# Patient Record
Sex: Female | Born: 1960 | Race: White | Hispanic: No | Marital: Married | Smoking: Former smoker
Health system: Southern US, Community
[De-identification: ages and names within clinical notes are randomized; demographics above are authoritative.]

## PROBLEM LIST (undated history)

## (undated) DIAGNOSIS — L309 Dermatitis, unspecified: Secondary | ICD-10-CM

## (undated) DIAGNOSIS — N6320 Unspecified lump in the left breast, unspecified quadrant: Secondary | ICD-10-CM

## (undated) DIAGNOSIS — F419 Anxiety disorder, unspecified: Secondary | ICD-10-CM

## (undated) DIAGNOSIS — E7800 Pure hypercholesterolemia, unspecified: Secondary | ICD-10-CM

## (undated) DIAGNOSIS — Z8619 Personal history of other infectious and parasitic diseases: Secondary | ICD-10-CM

## (undated) DIAGNOSIS — Z803 Family history of malignant neoplasm of breast: Secondary | ICD-10-CM

## (undated) DIAGNOSIS — J9819 Other pulmonary collapse: Secondary | ICD-10-CM

## (undated) DIAGNOSIS — N959 Unspecified menopausal and perimenopausal disorder: Secondary | ICD-10-CM

## (undated) DIAGNOSIS — K219 Gastro-esophageal reflux disease without esophagitis: Secondary | ICD-10-CM

## (undated) HISTORY — DX: Unspecified menopausal and perimenopausal disorder: N95.9

## (undated) HISTORY — DX: Unspecified lump in the left breast, unspecified quadrant: N63.20

## (undated) HISTORY — DX: Pure hypercholesterolemia, unspecified: E78.00

## (undated) HISTORY — DX: Anxiety disorder, unspecified: F41.9

## (undated) HISTORY — DX: Personal history of other infectious and parasitic diseases: Z86.19

## (undated) HISTORY — DX: Other pulmonary collapse: J98.19

## (undated) HISTORY — DX: Family history of malignant neoplasm of breast: Z80.3

## (undated) HISTORY — PX: BREAST BIOPSY: SHX20

## (undated) HISTORY — DX: Dermatitis, unspecified: L30.9

---
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
## 2003-08-23 DIAGNOSIS — J9819 Other pulmonary collapse: Secondary | ICD-10-CM

## 2003-08-23 HISTORY — DX: Other pulmonary collapse: J98.19

## 2004-11-09 ENCOUNTER — Inpatient Hospital Stay: Payer: Self-pay | Admitting: Surgery

## 2004-11-28 ENCOUNTER — Emergency Department: Payer: Self-pay | Admitting: General Surgery

## 2010-01-05 ENCOUNTER — Ambulatory Visit: Payer: Self-pay | Admitting: Obstetrics & Gynecology

## 2010-01-05 LAB — CONVERTED CEMR LAB
FSH: 42.7 milliintl units/mL
HCT: 45 % (ref 36.0–46.0)
Hemoglobin: 15 g/dL (ref 12.0–15.0)
MCHC: 33.3 g/dL (ref 30.0–36.0)
MCV: 95.7 fL (ref 78.0–100.0)
Platelets: 232 10*3/uL (ref 150–400)
Prolactin: 5.2 ng/mL
RBC: 4.7 M/uL (ref 3.87–5.11)
RDW: 13.3 % (ref 11.5–15.5)
TSH: 4.831 microintl units/mL — ABNORMAL HIGH (ref 0.350–4.500)
WBC: 5.3 10*3/uL (ref 4.0–10.5)

## 2010-01-15 ENCOUNTER — Encounter: Payer: Self-pay | Admitting: Obstetrics & Gynecology

## 2010-01-15 LAB — CONVERTED CEMR LAB
Free T4: 0.95 ng/dL (ref 0.80–1.80)
T3, Free: 3 pg/mL (ref 2.3–4.2)

## 2010-01-20 LAB — HM PAP SMEAR

## 2010-01-20 LAB — HM MAMMOGRAPHY

## 2010-01-20 LAB — CONVERTED CEMR LAB: Pap Smear: NORMAL

## 2010-03-09 ENCOUNTER — Ambulatory Visit: Payer: Self-pay | Admitting: Family Medicine

## 2010-03-09 DIAGNOSIS — N959 Unspecified menopausal and perimenopausal disorder: Secondary | ICD-10-CM | POA: Insufficient documentation

## 2010-03-09 DIAGNOSIS — L259 Unspecified contact dermatitis, unspecified cause: Secondary | ICD-10-CM | POA: Insufficient documentation

## 2010-03-09 DIAGNOSIS — F411 Generalized anxiety disorder: Secondary | ICD-10-CM | POA: Insufficient documentation

## 2010-03-09 DIAGNOSIS — N63 Unspecified lump in unspecified breast: Secondary | ICD-10-CM | POA: Insufficient documentation

## 2010-03-09 DIAGNOSIS — Z9189 Other specified personal risk factors, not elsewhere classified: Secondary | ICD-10-CM | POA: Insufficient documentation

## 2010-03-16 ENCOUNTER — Encounter: Payer: Self-pay | Admitting: Family Medicine

## 2010-03-23 ENCOUNTER — Encounter: Payer: Self-pay | Admitting: Family Medicine

## 2010-03-23 ENCOUNTER — Encounter: Admission: RE | Admit: 2010-03-23 | Discharge: 2010-03-23 | Payer: Self-pay | Admitting: General Surgery

## 2010-03-30 ENCOUNTER — Ambulatory Visit: Payer: Self-pay | Admitting: Family Medicine

## 2010-03-30 DIAGNOSIS — E7800 Pure hypercholesterolemia, unspecified: Secondary | ICD-10-CM | POA: Insufficient documentation

## 2010-04-02 LAB — CONVERTED CEMR LAB
ALT: 13 units/L (ref 0–35)
AST: 12 units/L (ref 0–37)
Albumin: 3.6 g/dL (ref 3.5–5.2)
Alkaline Phosphatase: 58 units/L (ref 39–117)
BUN: 12 mg/dL (ref 6–23)
Bilirubin, Direct: 0.1 mg/dL (ref 0.0–0.3)
CO2: 26 meq/L (ref 19–32)
Calcium: 8.8 mg/dL (ref 8.4–10.5)
Chloride: 105 meq/L (ref 96–112)
Cholesterol: 159 mg/dL (ref 0–200)
Creatinine, Ser: 0.7 mg/dL (ref 0.4–1.2)
GFR calc non Af Amer: 88.52 mL/min (ref >60)
Glucose, Bld: 81 mg/dL (ref 70–99)
HDL: 47.2 mg/dL (ref >39.00)
LDL Cholesterol: 97 mg/dL (ref 0–99)
Potassium: 4.1 meq/L (ref 3.5–5.1)
Sodium: 140 meq/L (ref 135–145)
Total Bilirubin: 0.5 mg/dL (ref 0.3–1.2)
Total CHOL/HDL Ratio: 3
Total Protein: 6.2 g/dL (ref 6.0–8.3)
Triglycerides: 74 mg/dL (ref 0.0–149.0)
VLDL: 14.8 mg/dL (ref 0.0–40.0)

## 2010-04-06 ENCOUNTER — Ambulatory Visit: Payer: Self-pay | Admitting: Family Medicine

## 2010-04-06 LAB — CONVERTED CEMR LAB
Cholesterol, target level: 200 mg/dL
HDL goal, serum: 40 mg/dL
LDL Goal: 160 mg/dL

## 2010-09-21 NOTE — Letter (Signed)
Summary: Biopsy Results Letter/[facility]  Biopsy Results Letter/[facility]   Imported By: Maryln Gottron 04/09/2010 13:26:00  _____________________________________________________________________  External Attachment:    Type:   Image     Comment:   External Document

## 2010-09-21 NOTE — Letter (Signed)
Summary: [facility]   Imported By: Lanelle Bal 04/05/2010 11:27:52  _____________________________________________________________________  External Attachment:    Type:   Image     Comment:   External Document

## 2010-09-21 NOTE — Assessment & Plan Note (Signed)
Summary: 1 m f/u mood/dlo   Vital Signs:  Patient profile:   50 year old female Height:      67 inches Weight:      201.8 pounds BMI:     31.72 Temp:     97.3 degrees F oral Pulse rate:   72 / minute Pulse rhythm:   regular BP sitting:   140 / 90  (left arm) Cuff size:   regular  Vitals Entered By: Benny Lennert CMA Duncan Dull) (April 06, 2010 9:07 AM)  History of Present Illness: Chief complaint 1 month followup   Anxiety, depression: On venalfaxine 37.5 mg daily. Significant improvement in mood..no anxiety. Doesn't feel on edge.  Sleeping  moderately well at night.  NO SI, NO HI. Now changed to second shift..has helped stress some. Has had some decreased appetitie.  3 lb weight loss.   Lipid Management History:      Negative NCEP/ATP III risk factors include female age less than 41 years old and non-tobacco-user status.        Her compliance with the TLC diet is fair.  The patient expresses understanding of adjunctive measures for cholesterol lowering.  Adjunctive measures started by the patient include aerobic exercise, fiber, omega-3 supplements, and weight reduction.    Problems Prior to Update: 1)  Pure Hypercholesterolemia  (ICD-272.0) 2)  Contact Dermatitis  (ICD-692.9) 3)  Breast Mass, Left  (ICD-611.72) 4)  Perimenopausal Syndrome  (ICD-627.9) 5)  Anxiety State, Unspecified  (ICD-300.00) 6)  Family History Breast Cancer 1st Degree Relative <50  (ICD-V16.3) 7)  Chickenpox, Hx of  (ICD-V15.9)  Current Medications (verified): 1)  Triamcinolone Acetonide 0.5 % Crea (Triamcinolone Acetonide) .... Apply To Affected Area Two Times A Day X 2 Week 2)  Venlafaxine Hcl 37.5 Mg Xr24h-Tab (Venlafaxine Hcl) .Marland Kitchen.. 1 Tab By Mouth Daily  Allergies (verified): No Known Drug Allergies  Past History:  Past medical, surgical, family and social histories (including risk factors) reviewed, and no changes noted (except as noted below).  Past Medical History: Reviewed history from  03/09/2010 and no changes required. Current Problems:  CHICKENPOX, HX OF (ICD-V15.9)    Past Surgical History: Reviewed history from 03/09/2010 and no changes required. breast biopsy 1981 Rt lung collaspe 2005  Family History: Reviewed history from 03/09/2010 and no changes required. Family History Breast cancer 1st degree relative <50 Family History Lung cancer Family History of Cardiovascular disorder father: CHF, no early MI. mother:  breast cancer, COPD siblings: healthy  Social History: Reviewed history from 03/09/2010 and no changes required. 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 Former Smoker  Review of Systems General:  Denies fatigue and fever. CV:  Denies chest pain or discomfort. Resp:  Denies shortness of breath.  Physical Exam  General:  overweight femal einNAD Mouth:  Oral mucosa and oropharynx without lesions or exudates.  Teeth in good repair. Neck:  no carotid bruit or thyromegaly no cervical or supraclavicular lymphadenopathy  Lungs:  Normal respiratory effort, chest expands symmetrically. Lungs are clear to auscultation, no crackles or wheezes. Heart:  Normal rate and regular rhythm. S1 and S2 normal without gallop, murmur, click, rub or other extra sounds. Abdomen:  Bowel sounds positive,abdomen soft and non-tender without masses, organomegaly or hernias noted. Pulses:  R and L posterior tibial pulses are full and equal bilaterally  Extremities:  no edema   Impression & Recommendations:  Problem # 1:  ANXIETY STATE, UNSPECIFIED (ICD-300.00) Improved on venlafaxine.  Working on stress reduction, relaxation.  Working on [employer] and weight loss, healthy lifestyle.  Not interested in increase in medication at this time.  Her updated medication list for this problem includes:     Venlafaxine Hcl 37.5 Mg Xr24h-tab (Venlafaxine hcl) .Marland Kitchen... 1 tab by mouth daily  Problem # 2:  PURE HYPERCHOLESTEROLEMIA (ICD-272.0) Well controlled.   Complete Medication List: 1)  Triamcinolone Acetonide 0.5 % Crea (Triamcinolone acetonide) .... Apply to affected area two times a day x 2 week 2)  Venlafaxine Hcl 37.5 Mg Xr24h-tab (Venlafaxine hcl) .Marland Kitchen.. 1 tab by mouth daily  Lipid Assessment/Plan:      Based on NCEP/ATP III, the patient's risk factor category is "0-1 risk factors".  The patient's lipid goals are as follows: Total cholesterol goal is 200; LDL cholesterol goal is 160; HDL cholesterol goal is 40; Triglyceride goal is 150.  Her LDL cholesterol goal has been met.     Patient Instructions: 1)  Please schedule a follow-up appointment in 1 year.  2)  Follow up earlier if needed.  Current Allergies (reviewed today): No known allergies

## 2010-09-21 NOTE — Assessment & Plan Note (Signed)
Summary: NEW PATIENT/[facility]   Vital Signs:  Patient profile:   50 year old female Height:      67 inches Weight:      204.8 pounds BMI:     32.19 Temp:     97.8 degrees F oral Pulse rate:   72 / minute Pulse rhythm:   regular BP sitting:   118 / 78  (left arm) Cuff size:   regular  Vitals Entered By: Benny Lennert CMA Duncan Dull) (March 09, 2010 10:19 AM)  History of Present Illness: Chief complaint new patient to be established    Seeing GYN.. going through perimenopause... mood issues most bothersome. Occ stress Anxiety/depression, moderate x 6 month to 1 year... recently given effexor 75 mg daily...never started.   Family and work stress.  No specific panic attacks...more generalized during the day. No Si. Anhedonia, no motivation. 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 Zoloft in past gave her SE.   Has supportive friends.. not interested in counseling.  Rash on arms abdomen .Marland Kitchennoted in past 2-3 days, moderately itchy. No specific   Preventive Screening-Counseling & Management  Alcohol-Tobacco     Smoking Status: quit  Caffeine-Diet-Exercise     Does Patient Exercise: no      Drug Use:  no.    Allergies (verified): No Known Drug Allergies  Past History:  Past Medical History: Current Problems:  CHICKENPOX, HX OF (ICD-V15.9)    Past Surgical History: breast biopsy 1981 Rt lung collaspe 2005  Family History: Family History Breast cancer 1st degree relative <50 Family History Lung cancer Family History of Cardiovascular disorder father: CHF, no early MI. 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 Former Smoker Occupation:  employed Drug Use:  no Does Patient Exercise:  no Smoking Status:  quit  Review of Systems General:  Complains of  fatigue; denies fever. CV:  Denies chest pain or discomfort. Resp:  Denies shortness of breath, sputum productive, and wheezing. GI:  Complains of constipation; denies abdominal pain and diarrhea. GU:  Denies dysuria.  Physical Exam  General:  overweight femal einNAD Ears:  External ear exam shows no significant lesions or deformities.  Otoscopic examination reveals clear canals, tympanic membranes are intact bilaterally without bulging, retraction, inflammation or discharge. Hearing is grossly normal bilaterally. Nose:  External nasal examination shows no deformity or inflammation. Nasal mucosa are pink and moist without lesions or exudates. Mouth:  Oral mucosa and oropharynx without lesions or exudates.  Teeth in good repair. Neck:  no carotid bruit or thyromegaly no cervical or supraclavicular lymphadenopathy  Lungs:  Normal respiratory effort, chest expands symmetrically. Lungs are clear to auscultation, no crackles or wheezes. Heart:  Normal rate and regular rhythm. S1 and S2 normal without gallop, murmur, click, rub or other extra sounds. Abdomen:  Bowel sounds positive,abdomen soft and non-tender without masses, organomegaly or hernias noted. Msk:  No deformity or scoliosis noted of thoracic or lumbar spine.   Pulses:  R and L posterior tibial pulses are full and equal bilaterally  Extremities:  no edema Neurologic:  No cranial nerve deficits noted. Station and gait are normal. Plantar reflexes are down-going bilaterally. DTRs are symmetrical throughout. Sensory, motor and coordinative functions appear intact. Skin:  Abdominal and arms...erythematous papules, excoriationsNo pustules, no blisters no discharge.  Psych:  Oriented X3 and slightly anxious.  Impression & Recommendations:  Problem # 1:  CONTACT DERMATITIS (ICD-692.9) Treat with topical steroid. Call if not improving. Review possible environmental causes.  Her updated medication list for this problem includes:     Triamcinolone Acetonide 0.5 % Crea (Triamcinolone acetonide) .Marland Kitchen... Apply to affected area two times a day x 2 week  Problem # 2:  PERIMENOPAUSAL SYNDROME (ICD-627.9) See #3...minimal vasomotor symptoms.   Problem # 3:  ANXIETY STATE, UNSPECIFIED (ICD-300.00) Poor control. .discussed options of treatment. Refuse behavoiral med  Her updated medication list for this problem includes:    Venlafaxine Hcl 37.5 Mg Xr24h-tab (Venlafaxine hcl) .Marland Kitchen... 1 tab by mouth daily  Problem # 4:  BREAST MASS, LEFT (ICD-611.72) Per GYN and radiology. WIll follow along.   Complete Medication List: 1)  Triamcinolone Acetonide 0.5 % Crea (Triamcinolone acetonide) .... Apply to affected area two times a day x 2 week 2)  Venlafaxine Hcl 37.5 Mg Xr24h-tab (Venlafaxine hcl) .Marland Kitchen.. 1 tab by mouth daily  Patient Instructions: 1)  Schedule lipids, CMET Dx 272.0 FASTING. 2)   Start venlafaxine 1 tab p daily. 3)  Please schedule a follow-up appointment in 1 month mood. Prescriptions: VENLAFAXINE HCL 37.5 MG XR24H-TAB (VENLAFAXINE HCL) 1 tab by mouth daily  #30 x 5   Entered and Authorized by:   Kerby Nora MD   Signed by:   Kerby Nora MD on 03/09/2010   Method used:   Electronically to        [facility]* (retail)       [address]       [place], [place]  [address]       Ph: [phone]       Fax: [phone]   RxID:   [id number] TRIAMCINOLONE ACETONIDE 0.5 % CREA (TRIAMCINOLONE ACETONIDE) Apply to affected area two times a day x 2 week  #30-60 mg x 1   Entered and Authorized by:   Kerby Nora MD   Signed by:   Kerby Nora MD on 03/09/2010   Method used:   Electronically to        [facility]* (retail)       [address]       [place], [place]  [address]       Ph: [phone]       Fax: [phone]   RxID:   [id number]   Prior Medications (reviewed today): None Current Allergies (reviewed today): No known allergies  PAP Result Date:   01/20/2010 PAP Result:  normal PAP Next Due:  1 yr Mammogram Result Date:  01/20/2010 Mammogram Result:  abnormal mammogram, left breast mass, scheduled for biopsy

## 2011-01-04 NOTE — Assessment & Plan Note (Signed)
NAME:  Cindy Mccoy, WEAVER NO.:  [id number]   MEDICAL RECORD NO.:  [id number]          PATIENT TYPE:  POB   LOCATION:  [facility] at [facility]         FACILITY:  [facility]   PHYSICIAN:  Jaynie Collins, MD     DATE OF BIRTH:  1961-01-31   DATE OF SERVICE:  01/05/2010                                  CLINIC NOTE   CHIEF COMPLAINT:  Establishment of gynecologic care and possible  perimenopausal symptoms.   HISTORY OF PRESENT ILLNESS:  The patient is a 50 year old gravida 5,  para 2-0-3-2 who is here today to establish gynecologic care.  The  patient has not seen a gynecologist over 11 years and denies any history  of abnormal Pap smears.  She has not had a mammogram in over 11 years  also and not seen a doctor in the same period of time.  The reason for  her appointment today is that she has noted that in the last few months,  her periods have gotten more irregular and earlier this month she did  have a period that lasted for 2 weeks with heavy flow and passing clots.  This is very usual for her and this was also associated with some  moderate pain.  The patient denies any other abnormal vaginal discharge  or any other gynecologic symptoms.  She is sexually active and uses  condoms for contraception.  She is interested in going on a hormonal  contraception at this point, specifically the NuvaRing.  She also is  endorsing hot flashes, mood swings and night sweats and wants to be put  on a medication to help with these symptoms.  She denies any other  concerns.   PAST OBSTETRIC AND GYNECOLOGIC HISTORY:  The patient had menarche at age  32.  She had normal menstrual periods until the last few months.  She  does deny any intermenstrual bleeding.  The patient has had 2 vaginal  deliveries and 3 miscarriages.  She uses condoms for contraception.  She  denies any sexually transmitted infections or cervical dysplasia.  Her  last Pap smear and mammogram were more than 11 years ago and  she has  also not seen any doctor in that period of time.   PAST MEDICAL HISTORY:  None.   PAST SURGICAL HISTORY:  The patient had a pneumothorax which was fixed  in [facility] in March 2011.  She also had a  biopsy of her left breast at age 50.   MEDICATIONS:  None.   ALLERGIES:  No known drug allergies.   FAMILY HISTORY:  The patient does have a family history of breast cancer  in her mother and her father has heart disease and heart attack.  No  other gynecologic cancers.   SOCIAL HISTORY:  The patient lives with her family.  She does not smoke,  drink alcohol, or use any illicit drugs.   REVIEW OF SYSTEMS:  The patient endorses fatigue, night sweats, mood  swings and hot flashes.   PHYSICAL EXAMINATION:  VITAL SIGNS:  Pulse is 64, blood pressure is  137/92, weight is 200 pounds, height is 5 feet 7 inches.  GENERAL:  No apparent distress.  HEENT:  Normocephalic, atraumatic.  NECK:  Supple.  Normal thyroid.  LUNGS:  Clear to auscultation bilaterally.  HEART:  Regular rate and rhythm.  ABDOMEN:  Soft, nontender, and nondistended.  BREASTS:  Symmetric in size, nontender.  No abnormal masses, skin  changes, lymphadenopathy, or nipple drainage noted.  EXTREMITIES:  The patient does have bilateral pedal cyanosis noted, but  good capillary refill.  She does report that she feels cold in her legs.  The rest of her bilateral lower extremities appear normal.  No clubbing  and no edema were appreciated.  PELVIC:  Normal external female genitalia with mild atrophy noted in the  vestibular area and mild loss of rugae was noted also in the vagina.  Normal discharge.  No lesions seen.  Normal multiparous cervical os.  A  Pap smear was obtained.  On bimanual exam, the patient had a normal-  sized mobile uterus.  No adnexal masses were palpated secondary to  habitus and no tenderness on bimanual examination.   ASSESSMENT AND PLAN:  The patient is a 50 year old  gravida 5, para 2-0-3-  2 who is here to establish gynecologic care after several years.  The  patient just had a Pap smear today and we will follow up the results.  She had a normal breast examination, but a mammogram will be done for  further evaluation and that will be scheduled at the end of this  encounter.  The patient will also be referred to [facility] for  primary care and other preventative health maintenance issues.  Given  her irregular menses, labs will be checked including a CBC, TSH,  prolactin and FSH.  A pelvic ultrasound would also be obtained for  further evaluation.  The patient was told that depending on the results  of this and also if her bleeding continues to be concerning, she will  need an endometrial biopsy to rule out any hyperplasia or neoplasia and  the patient is agreeable to this.  The patient is interested in using a  NuvaRing for now for birth control method.  She does not want to  continue to use condoms.  The risks of hormonal therapy were explained  in detail including the risks of DVTs, PE, strokes and cancer,  especially her breast cancer.  The patient does want to proceed with  using a NuvaRing and she was given samples for this.  As for her mood  symptoms, she was told that she might have an effect from the NuvaRing  and estrogen absorption in her system that might help her perimenopausal  symptoms, but the patient does want something that is specific to her  mood, so she was given a prescription for Effexor XR 75 mg daily and  told that it could take up to 4-6 weeks to get to a steady state.  She  was told to come in for discussion of all the results of these studies  and we would see if there is any further evaluation that needs to be  done, specifically an endometrial biopsy.  The patient did voiced  understanding of plan.  She was told to call or come back in for any  further gynecologic symptoms or concerns.  The patient will also  follow  up with her appointment at [facility] to establish primary care.           ______________________________  Jaynie Collins, MD     UA/MEDQ  D:  01/05/2010  T:  01/05/2010  Job:  [id number] 

## 2011-12-01 ENCOUNTER — Encounter: Payer: Self-pay | Admitting: Family Medicine

## 2011-12-01 ENCOUNTER — Ambulatory Visit (INDEPENDENT_AMBULATORY_CARE_PROVIDER_SITE_OTHER): Payer: BC Managed Care – PPO | Admitting: Family Medicine

## 2011-12-01 DIAGNOSIS — J309 Allergic rhinitis, unspecified: Secondary | ICD-10-CM

## 2011-12-01 MED ORDER — FLUTICASONE PROPIONATE 50 MCG/ACT NA SUSP: 2.0000 | Freq: Every day | NASAL | Status: DC

## 2011-12-01 NOTE — Patient Instructions (Signed)
I think is allergic rhinitis.  Use flonase as directed.   Drink lots of fluids.  Treat sympotmatically with Mucinex, nasal saline irrigation, and Tylenol/Ibuprofen. Also try claritin D or zyrtec D over the counter- two times a day as needed ( have to sign for them at pharmacy). Call if not improving as expected in 5-7 days.

## 2011-12-01 NOTE — Progress Notes (Signed)
SUBJECTIVE:  Cindy Mccoy is a 51 y.o. female who complains of coryza, congestion, sneezing and itching in eyes for 7 days. She denies a history of anorexia, chest pain, chills, dizziness, fatigue and fevers and denies a history of asthma. Patient denies smoke cigarettes.   Patient Active Problem List  Diagnoses  . PURE HYPERCHOLESTEROLEMIA  . ANXIETY STATE, UNSPECIFIED  . BREAST MASS, LEFT  . PERIMENOPAUSAL SYNDROME  . CONTACT DERMATITIS  . CHICKENPOX, HX OF   Past Medical History  Diagnosis Date  . Anxiety   . Breast mass, left   . History of chicken pox   . Dermatitis     contact  . Pure hypercholesterolemia   . Family history of malignant neoplasm of breast   . Unspecified menopausal and postmenopausal disorder   . Collapsed lung 2005   Past Surgical History  Procedure Date  . Breast biopsy 1981   History  Substance Use Topics  . Smoking status: Former Games developer  . Smokeless tobacco: Not on file   Comment: 29 pack per year history, quit several years ago  . Alcohol Use: Yes     1-2 drinks per month   Family History  Problem Relation Age of Onset  . Cancer Mother     breast  . COPD Mother   . Heart disease Father     CHF   Allergies not on file No current outpatient prescriptions on file prior to visit.   The PMH, PSH, Social History, Family History, Medications, and allergies have been reviewed in [facility], and have been updated if relevant.  OBJECTIVE: BP 128/82  Pulse 64  Temp(Src) 97.4 F (36.3 C) (Oral)  Wt 210 lb (95.255 kg)  She appears well, vital signs are as noted. Ears normal.  Throat and pharynx normal.  Neck supple. No adenopathy in the neck. Nose is congested. Sinuses non tender. The chest is clear, without wheezes or rales.  ASSESSMENT:  allergic rhinitis  PLAN: Symptomatic therapy suggested: flonase, push fluids, rest and return office visit prn if symptoms persist or worsen. Lack of antibiotic effectiveness discussed with her. Call or  return to clinic prn if these symptoms worsen or fail to improve as anticipated.

## 2011-12-13 ENCOUNTER — Encounter: Payer: Self-pay | Admitting: Obstetrics & Gynecology

## 2011-12-13 ENCOUNTER — Ambulatory Visit: Payer: BC Managed Care – PPO | Admitting: Obstetrics & Gynecology

## 2011-12-13 ENCOUNTER — Other Ambulatory Visit: Payer: Self-pay | Admitting: Obstetrics & Gynecology

## 2011-12-13 ENCOUNTER — Ambulatory Visit (INDEPENDENT_AMBULATORY_CARE_PROVIDER_SITE_OTHER): Payer: BC Managed Care – PPO | Admitting: Obstetrics & Gynecology

## 2011-12-13 VITALS — BP 120/93 | HR 68 | Ht 66.75 in | Wt 208.0 lb

## 2011-12-13 DIAGNOSIS — Z124 Encounter for screening for malignant neoplasm of cervix: Secondary | ICD-10-CM

## 2011-12-13 DIAGNOSIS — Z113 Encounter for screening for infections with a predominantly sexual mode of transmission: Secondary | ICD-10-CM

## 2011-12-13 DIAGNOSIS — Z1231 Encounter for screening mammogram for malignant neoplasm of breast: Secondary | ICD-10-CM

## 2011-12-13 DIAGNOSIS — Z0000 Encounter for general adult medical examination without abnormal findings: Secondary | ICD-10-CM

## 2011-12-13 DIAGNOSIS — Z01419 Encounter for gynecological examination (general) (routine) without abnormal findings: Secondary | ICD-10-CM

## 2011-12-13 DIAGNOSIS — N951 Menopausal and female climacteric states: Secondary | ICD-10-CM

## 2011-12-13 MED ORDER — FLUOXETINE HCL 20 MG PO TABS: 20.0000 mg | ORAL_TABLET | Freq: Every day | ORAL | Status: DC

## 2011-12-13 NOTE — Progress Notes (Signed)
Patient is here for routine physical.  Has questions about menopause, no cycle for 6 months and then had one last week.  She also qualifies for BRCA  Screening as her mother and aunt have breast cancer.

## 2011-12-13 NOTE — Progress Notes (Signed)
Subjective:    Cindy Mccoy is a 51 y.o. female who presents for an annual exam. She complains of skipping 6 periods this year and feeling depressed due to "hormone imbalance". She is afraid of breast cancer and does not want HRT.  The patient is sexually active. GYN screening history: last pap: was normal. The patient wears seatbelts: yes. The patient participates in regular exercise: no. Has the patient ever been transfused or tattooed?: no. The patient reports that there is not domestic violence in her life.   Menstrual History: OB History    Grav Para Term Preterm Abortions TAB SAB Ect Mult Living                  Menarche age: 14 Patient's last menstrual period was 12/06/2011.    The following portions of the patient's history were reviewed and updated as appropriate: allergies, current medications, past family history, past medical history, past social history, past surgical history and problem list.  Review of Systems A comprehensive review of systems was negative.    Objective:    BP 120/93  Pulse 68  Ht 5' 6.75" (1.695 m)  Wt 208 lb (94.348 kg)  BMI 32.82 kg/m2  LMP 12/06/2011  General Appearance:    Alert, cooperative, no distress, appears stated age  Head:    Normocephalic, without obvious abnormality, atraumatic  Eyes:    PERRL, conjunctiva/corneas clear, EOM's intact, fundi    benign, both eyes  Ears:    Normal TM's and external ear canals, both ears  Nose:   Nares normal, septum midline, mucosa normal, no drainage    or sinus tenderness  Throat:   Lips, mucosa, and tongue normal; teeth and gums normal  Neck:   Supple, symmetrical, trachea midline, no adenopathy;    thyroid:  no enlargement/tenderness/nodules; no carotid   bruit or JVD  Back:     Symmetric, no curvature, ROM normal, no CVA tenderness  Lungs:     Clear to auscultation bilaterally, respirations unlabored  Chest Wall:    No tenderness or deformity   Heart:    Regular rate and rhythm, S1 and S2  normal, no murmur, rub   or gallop  Breast Exam:    No tenderness, masses, or nipple abnormality  Abdomen:     Soft, non-tender, bowel sounds active all four quadrants,    no masses, no organomegaly  Genitalia:    Normal female without lesion, discharge or tenderness, NSS mid plane, NT, non-enlarged adnexa     Extremities:   Extremities normal, atraumatic, no cyanosis or edema  Pulses:   2+ and symmetric all extremities  Skin:   Skin color, texture, turgor normal, no rashes or lesions  Lymph nodes:   Cervical, supraclavicular, and axillary nodes normal  Neurologic:   CNII-XII intact, normal strength, sensation and reflexes    throughout  .    Assessment:    Healthy female exam.  Perimenopausal depression   Plan:     pap smear Prozac 20 mg daily RTC 1 month

## 2011-12-14 ENCOUNTER — Ambulatory Visit
Admission: RE | Admit: 2011-12-14 | Discharge: 2011-12-14 | Disposition: A | Discharge status: Home / Self Care | Payer: BC Managed Care – PPO | Source: Ambulatory Visit | Attending: Obstetrics & Gynecology | Admitting: Obstetrics & Gynecology

## 2011-12-14 DIAGNOSIS — Z1231 Encounter for screening mammogram for malignant neoplasm of breast: Secondary | ICD-10-CM

## 2011-12-14 LAB — COMPREHENSIVE METABOLIC PANEL
ALT: 13 U/L (ref 0–35)
AST: 14 U/L (ref 0–37)
Albumin: 4.3 g/dL (ref 3.5–5.2)
Alkaline Phosphatase: 59 U/L (ref 39–117)
BUN: 13 mg/dL (ref 6–23)
CO2: 26 mEq/L (ref 19–32)
Calcium: 8.7 mg/dL (ref 8.4–10.5)
Chloride: 105 mEq/L (ref 96–112)
Creat: 0.78 mg/dL (ref 0.50–1.10)
Glucose, Bld: 109 mg/dL — ABNORMAL HIGH (ref 70–99)
Potassium: 4.2 mEq/L (ref 3.5–5.3)
Sodium: 141 mEq/L (ref 135–145)
Total Bilirubin: 0.5 mg/dL (ref 0.3–1.2)
Total Protein: 6.6 g/dL (ref 6.0–8.3)

## 2011-12-14 LAB — TSH: TSH: 7.415 u[IU]/mL — ABNORMAL HIGH (ref 0.350–4.500)

## 2011-12-14 LAB — CBC
HCT: 46.5 % — ABNORMAL HIGH (ref 36.0–46.0)
Hemoglobin: 15.1 g/dL — ABNORMAL HIGH (ref 12.0–15.0)
MCH: 30.7 pg (ref 26.0–34.0)
MCHC: 32.5 g/dL (ref 30.0–36.0)
MCV: 94.5 fL (ref 78.0–100.0)
Platelets: 214 10*3/uL (ref 150–400)
RBC: 4.92 MIL/uL (ref 3.87–5.11)
RDW: 13.6 % (ref 11.5–15.5)
WBC: 5.4 10*3/uL (ref 4.0–10.5)

## 2011-12-14 NOTE — Progress Notes (Signed)
Addended by: Vinnie Langton C on: 12/14/2011 09:09 AM   Modules accepted: Orders

## 2011-12-21 ENCOUNTER — Telehealth: Payer: Self-pay | Admitting: *Deleted

## 2011-12-21 NOTE — Telephone Encounter (Signed)
Message copied by Meleny Cower on Wed Dec 21, 2011  4:03 PM ------      Message from: Allie Bossier      Created: Tue Dec 20, 2011  9:44 AM       She needs to have an appt with me because her thyroid isn't working and I'll need to start her on synthroid!

## 2011-12-21 NOTE — Telephone Encounter (Signed)
Patient notified of thyroid.

## 2012-01-02 ENCOUNTER — Ambulatory Visit (INDEPENDENT_AMBULATORY_CARE_PROVIDER_SITE_OTHER): Payer: BC Managed Care – PPO | Admitting: Obstetrics & Gynecology

## 2012-01-02 VITALS — BP 128/93 | HR 71 | Wt 199.1 lb

## 2012-01-02 DIAGNOSIS — E039 Hypothyroidism, unspecified: Secondary | ICD-10-CM

## 2012-01-02 MED ORDER — LEVOTHYROXINE SODIUM 25 MCG PO TABS: 25.0000 ug | ORAL_TABLET | Freq: Every day | ORAL | Status: DC

## 2012-01-02 NOTE — Progress Notes (Signed)
  Subjective:    Patient ID: Cindy Mccoy, female    DOB: Jul 18, 1961, 51 y.o.   MRN: [id number]  HPI Cindy Mccoy is here today because her TSH indicates hypothyroidism. I will start her on 25 mcg of synthroid and titrate her dose gradually. I have told her that once her TSH is stable and normal, I will wean off her Prozac. She does think the prozac has helped slightly.   Review of Systems   Her fasting glucose was 109 and will need to be rechecked. Objective:   Physical Exam        Assessment & Plan:  As above

## 2012-04-24 ENCOUNTER — Other Ambulatory Visit (INDEPENDENT_AMBULATORY_CARE_PROVIDER_SITE_OTHER): Payer: BC Managed Care – PPO | Admitting: *Deleted

## 2012-04-24 ENCOUNTER — Encounter: Payer: Self-pay | Admitting: Obstetrics & Gynecology

## 2012-04-24 DIAGNOSIS — E0590 Thyrotoxicosis, unspecified without thyrotoxic crisis or storm: Secondary | ICD-10-CM

## 2012-04-24 LAB — TSH: TSH: 6.41 u[IU]/mL — ABNORMAL HIGH (ref 0.350–4.500)

## 2012-04-26 ENCOUNTER — Telehealth: Payer: Self-pay | Admitting: *Deleted

## 2012-04-26 DIAGNOSIS — E0590 Thyrotoxicosis, unspecified without thyrotoxic crisis or storm: Secondary | ICD-10-CM

## 2012-04-26 MED ORDER — LEVOTHYROXINE SODIUM 50 MCG PO TABS: 50.0000 ug | ORAL_TABLET | Freq: Every day | ORAL | Status: DC

## 2012-04-26 NOTE — Telephone Encounter (Signed)
Message copied by Panhia Cower on Thu Apr 26, 2012  2:11 PM ------      Message from: Nicholaus Bloom C      Created: Wed Apr 25, 2012  8:31 AM       Hi.      I enjoyed working with you yesterday!      Can you please increase her synthroid from 25 to 50 mcg per day and tell her to come in for another TSH in about 2 months?      Thanks

## 2012-07-30 ENCOUNTER — Other Ambulatory Visit: Payer: BC Managed Care – PPO

## 2012-08-01 ENCOUNTER — Other Ambulatory Visit (INDEPENDENT_AMBULATORY_CARE_PROVIDER_SITE_OTHER): Payer: BC Managed Care – PPO | Admitting: *Deleted

## 2012-08-01 DIAGNOSIS — E0590 Thyrotoxicosis, unspecified without thyrotoxic crisis or storm: Secondary | ICD-10-CM

## 2012-08-01 NOTE — Progress Notes (Signed)
Needs follow up for tsh to determine dose.

## 2012-08-02 ENCOUNTER — Telehealth: Payer: Self-pay | Admitting: *Deleted

## 2012-08-02 DIAGNOSIS — E0590 Thyrotoxicosis, unspecified without thyrotoxic crisis or storm: Secondary | ICD-10-CM

## 2012-08-02 LAB — TSH: TSH: 8.962 u[IU]/mL — ABNORMAL HIGH (ref 0.350–4.500)

## 2012-08-02 MED ORDER — LEVOTHYROXINE SODIUM 75 MCG PO TABS: 75.0000 ug | ORAL_TABLET | Freq: Every day | ORAL | Status: DC

## 2012-08-02 NOTE — Telephone Encounter (Signed)
Patient notified of result and dose change has been called in per Dr. Marice Potter.

## 2012-10-12 ENCOUNTER — Telehealth: Payer: Self-pay | Admitting: Family Medicine

## 2012-10-12 NOTE — Telephone Encounter (Signed)
Pt sch for 10/16/2012

## 2012-10-12 NOTE — Telephone Encounter (Addendum)
Pt needs a CPE for ins purposes prior to the end of the month, but can only come in around 8:00 a.m.-8:15 a.m. She needs this prior to 10/19/2012. Can you accommodate this apptmt for a CPE? Thank you.

## 2012-10-12 NOTE — Telephone Encounter (Signed)
Yes.. Put her in on 2/25 Tuesday 8:15-8:45 ASAP before slot fills.  I CC'd: [employer]

## 2012-10-16 ENCOUNTER — Encounter: Payer: BC Managed Care – PPO | Admitting: Family Medicine

## 2012-10-23 ENCOUNTER — Encounter: Payer: BC Managed Care – PPO | Admitting: Family Medicine

## 2012-11-16 ENCOUNTER — Encounter: Payer: Self-pay | Admitting: Family Medicine

## 2012-11-16 ENCOUNTER — Ambulatory Visit (INDEPENDENT_AMBULATORY_CARE_PROVIDER_SITE_OTHER): Payer: BC Managed Care – PPO | Admitting: Family Medicine

## 2012-11-16 VITALS — BP 130/82 | HR 73 | Temp 98.1°F | Ht 66.75 in | Wt 216.0 lb

## 2012-11-16 DIAGNOSIS — F411 Generalized anxiety disorder: Secondary | ICD-10-CM

## 2012-11-16 DIAGNOSIS — E039 Hypothyroidism, unspecified: Secondary | ICD-10-CM | POA: Insufficient documentation

## 2012-11-16 DIAGNOSIS — E7800 Pure hypercholesterolemia, unspecified: Secondary | ICD-10-CM

## 2012-11-16 DIAGNOSIS — Z1212 Encounter for screening for malignant neoplasm of rectum: Secondary | ICD-10-CM

## 2012-11-16 NOTE — Progress Notes (Signed)
Subjective:    Patient ID: Cindy Mccoy, female    DOB: 01/23/1961, 52 y.o.   MRN: [id number]  HPI The patient is here for chronic health issue maintenance.   She sees GYN for PAP smears and CPX. Last pap was done 11/2011   Has noted knot on knee.  Elevated Cholesterol: Due for re-eval. On no medication. Diet compliance:" clean eating" Exercise: 4-5 times a week, cardio Other complaints: Wt Readings from Last 3 Encounters:  11/16/12 216 lb (97.977 kg)  01/02/12 199 lb 2 oz (90.323 kg)  12/13/11 208 lb (94.348 kg)     Hypothyroid:  On synthroid 75 mcg per Dr. Marice Potter. Due for re-eval.  Lab Results  Component Value Date   TSH 8.962* 08/01/2012   Anxiety, well controlled. Now off prozac, did not tolerate.   Review of Systems  Constitutional: Negative for fever, fatigue and unexpected weight change.  HENT: Negative for ear pain, congestion, sore throat, sneezing, trouble swallowing and sinus pressure.   Eyes: Negative for pain and itching.  Respiratory: Negative for cough, shortness of breath and wheezing.   Cardiovascular: Negative for chest pain, palpitations and leg swelling.  Gastrointestinal: Negative for nausea, abdominal pain, diarrhea, constipation and blood in stool.  Genitourinary: Negative for dysuria, hematuria, vaginal discharge, difficulty urinating and menstrual problem.  Skin: Negative for rash.  Neurological: Negative for syncope, weakness, light-headedness, numbness and headaches.  Psychiatric/Behavioral: Negative for confusion and dysphoric mood. The patient is not nervous/anxious.        Objective:   Physical Exam  Constitutional: Vital signs are normal. She appears well-developed and well-nourished. She is cooperative.  Non-toxic appearance. She does not appear ill. No distress.  HENT:  Head: Normocephalic.  Right Ear: Hearing, tympanic membrane, external ear and ear canal normal. Tympanic membrane is not erythematous, not retracted and not bulging.   Left Ear: Hearing, tympanic membrane, external ear and ear canal normal. Tympanic membrane is not erythematous, not retracted and not bulging.  Nose: No mucosal edema or rhinorrhea. Right sinus exhibits no maxillary sinus tenderness and no frontal sinus tenderness. Left sinus exhibits no maxillary sinus tenderness and no frontal sinus tenderness.  Mouth/Throat: Uvula is midline, oropharynx is clear and moist and mucous membranes are normal.  Eyes: Conjunctivae, EOM and lids are normal. Pupils are equal, round, and reactive to light. No foreign bodies found.  Neck: Trachea normal and normal range of motion. Neck supple. Carotid bruit is not present. No mass and no thyromegaly present.  Cardiovascular: Normal rate, regular rhythm, S1 normal, S2 normal, normal heart sounds, intact distal pulses and normal pulses.  Exam reveals no gallop and no friction rub.   No murmur heard. Pulmonary/Chest: Effort normal and breath sounds normal. Not tachypneic. No respiratory distress. She has no decreased breath sounds. She has no wheezes. She has no rhonchi. She has no rales.  Abdominal: Soft. Normal appearance and bowel sounds are normal. There is no tenderness.  Musculoskeletal:       Right knee: She exhibits normal range of motion. No tenderness found.  Right anterior knee mobile firm pea size cysts x 2 , nontender  Neurological: She is alert.  Skin: Skin is warm, dry and intact. No rash noted.  Psychiatric: Her speech is normal and behavior is normal. Judgment and thought content normal. Her mood appears not anxious. Cognition and memory are normal. She does not exhibit a depressed mood.          Assessment & Plan:  The patient's preventative maintenance  and recommended screening tests for an annual wellness exam were reviewed in full today. Brought up to date unless services declined.  Counselled on the importance of diet, exercise, and its role in overall health and mortality. The patient's FH and  SH was reviewed, including their home life, tobacco status, and drug and alcohol status.   DVE/pap: per Dr. Marice Potter. Mammogram/DVE: Dr. Marice Potter Vaccines: Tdap 2012 Colon: iFOB yearly. DEXA: never had, possibly family history of osteoporosis in mother age 41, low risk... Start age 56. Nonsmoker

## 2012-11-16 NOTE — Assessment & Plan Note (Addendum)
Due for re-eval. Encouraged exercise, weight loss, healthy eating habits.  

## 2012-11-16 NOTE — Assessment & Plan Note (Signed)
Stable control on on medication

## 2012-11-16 NOTE — Assessment & Plan Note (Signed)
Due for re-eval on current dose of synthroid 75 mch. Pt reports feeling much better on this dose.

## 2012-11-16 NOTE — Patient Instructions (Signed)
Come back for fasting labs early next week.

## 2012-11-19 ENCOUNTER — Other Ambulatory Visit (INDEPENDENT_AMBULATORY_CARE_PROVIDER_SITE_OTHER): Payer: BC Managed Care – PPO

## 2012-11-19 DIAGNOSIS — E039 Hypothyroidism, unspecified: Secondary | ICD-10-CM

## 2012-11-19 DIAGNOSIS — E7800 Pure hypercholesterolemia, unspecified: Secondary | ICD-10-CM

## 2012-11-19 LAB — T3, FREE: T3, Free: 3.4 pg/mL (ref 2.3–4.2)

## 2012-11-19 LAB — LIPID PANEL
Cholesterol: 162 mg/dL (ref 0–200)
HDL: 43.3 mg/dL (ref >39.00)
LDL Cholesterol: 101 mg/dL — ABNORMAL HIGH (ref 0–99)
Total CHOL/HDL Ratio: 4
Triglycerides: 89 mg/dL (ref 0.0–149.0)
VLDL: 17.8 mg/dL (ref 0.0–40.0)

## 2012-11-19 LAB — TSH: TSH: 3.33 u[IU]/mL (ref 0.35–5.50)

## 2012-11-19 LAB — COMPREHENSIVE METABOLIC PANEL
ALT: 17 U/L (ref 0–35)
AST: 14 U/L (ref 0–37)
Albumin: 3.7 g/dL (ref 3.5–5.2)
Alkaline Phosphatase: 55 U/L (ref 39–117)
BUN: 14 mg/dL (ref 6–23)
CO2: 27 mEq/L (ref 19–32)
Calcium: 9 mg/dL (ref 8.4–10.5)
Chloride: 103 mEq/L (ref 96–112)
Creatinine, Ser: 0.9 mg/dL (ref 0.4–1.2)
GFR: 74.65 mL/min (ref >60.00)
Glucose, Bld: 103 mg/dL — ABNORMAL HIGH (ref 70–99)
Potassium: 4.3 mEq/L (ref 3.5–5.1)
Sodium: 140 mEq/L (ref 135–145)
Total Bilirubin: 0.5 mg/dL (ref 0.3–1.2)
Total Protein: 6.8 g/dL (ref 6.0–8.3)

## 2012-11-19 LAB — T4, FREE: Free T4: 1.17 ng/dL (ref 0.60–1.60)

## 2012-11-20 ENCOUNTER — Other Ambulatory Visit (INDEPENDENT_AMBULATORY_CARE_PROVIDER_SITE_OTHER): Payer: BC Managed Care – PPO

## 2012-11-20 ENCOUNTER — Encounter: Payer: Self-pay | Admitting: *Deleted

## 2012-11-20 DIAGNOSIS — Z1212 Encounter for screening for malignant neoplasm of rectum: Secondary | ICD-10-CM

## 2012-11-20 LAB — FECAL OCCULT BLOOD, IMMUNOCHEMICAL: Fecal Occult Bld: NEGATIVE

## 2012-11-22 ENCOUNTER — Other Ambulatory Visit: Payer: Self-pay | Admitting: *Deleted

## 2012-11-22 DIAGNOSIS — E0590 Thyrotoxicosis, unspecified without thyrotoxic crisis or storm: Secondary | ICD-10-CM

## 2012-11-22 MED ORDER — LEVOTHYROXINE SODIUM 75 MCG PO TABS: 75.0000 ug | ORAL_TABLET | Freq: Every day | ORAL | Status: DC

## 2012-11-28 NOTE — Telephone Encounter (Signed)
Pt ck on status of levothyroxine refill; advised sent to [facility] on 11/23/12; pt will contact pharmacy.

## 2012-12-26 ENCOUNTER — Telehealth: Payer: Self-pay | Admitting: *Deleted

## 2012-12-26 DIAGNOSIS — E0590 Thyrotoxicosis, unspecified without thyrotoxic crisis or storm: Secondary | ICD-10-CM

## 2012-12-26 MED ORDER — LEVOTHYROXINE SODIUM 75 MCG PO TABS: 75.0000 ug | ORAL_TABLET | Freq: Every day | ORAL | Status: DC

## 2012-12-26 NOTE — Telephone Encounter (Signed)
Patient needs a refill of her levothyroxine medication 75 mcg.

## 2013-03-14 ENCOUNTER — Ambulatory Visit (INDEPENDENT_AMBULATORY_CARE_PROVIDER_SITE_OTHER): Payer: BC Managed Care – PPO | Admitting: Family Medicine

## 2013-03-14 VITALS — BP 116/80 | HR 72 | Temp 97.7°F | Ht 66.75 in | Wt 217.5 lb

## 2013-03-14 DIAGNOSIS — E039 Hypothyroidism, unspecified: Secondary | ICD-10-CM

## 2013-03-14 LAB — T4, FREE: Free T4: 1.09 ng/dL (ref 0.60–1.60)

## 2013-03-14 LAB — T3, FREE: T3, Free: 2.9 pg/mL (ref 2.3–4.2)

## 2013-03-14 LAB — TSH: TSH: 3.77 u[IU]/mL (ref 0.35–5.50)

## 2013-03-14 NOTE — Assessment & Plan Note (Signed)
Symptoms that may correspond with low thyroid.. Will eval with TSH, free t3 and free t4.

## 2013-03-14 NOTE — Addendum Note (Signed)
Addended by: Baldomero Lamy on: 03/14/2013 12:52 PM   Modules accepted: Orders

## 2013-03-14 NOTE — Progress Notes (Signed)
  Subjective:    Patient ID: Cindy Mccoy, female    DOB: [birth year], 52 y.o.   MRN: [id number]  HPI  52 year old female presents with  1-2 months of feeling fatigued, occ eye twitching, she has been feeling more depressed.  She had these symptoms prior to being diagnosed with hypothyroid.  Has been talking it regularly without fail. She has been under stress, no SI, no HI. No menses since Feb. She is post menopause. She has night sweats.   Wt Readings from Last 3 Encounters:  03/14/13 217 lb 8 oz (98.657 kg)  11/16/12 216 lb (97.977 kg)  01/02/12 199 lb 2 oz (90.323 kg)      Lab Results  Component Value Date   TSH 3.33 11/19/2012      Review of Systems  Constitutional: Positive for fatigue. Negative for fever.  HENT: Negative for ear pain.   Eyes: Negative for pain.  Respiratory: Negative for chest tightness and shortness of breath.   Cardiovascular: Negative for chest pain, palpitations and leg swelling.  Gastrointestinal: Negative for abdominal pain.  Genitourinary: Negative for dysuria.       Objective:   Physical Exam  Constitutional: Vital signs are normal. She appears well-developed and well-nourished. She is cooperative.  Non-toxic appearance. She does not appear ill. No distress.  Overweight , central obesity  HENT:  Head: Normocephalic.  Right Ear: Hearing, tympanic membrane, external ear and ear canal normal. Tympanic membrane is not erythematous, not retracted and not bulging.  Left Ear: Hearing, tympanic membrane, external ear and ear canal normal. Tympanic membrane is not erythematous, not retracted and not bulging.  Nose: No mucosal edema or rhinorrhea. Right sinus exhibits no maxillary sinus tenderness and no frontal sinus tenderness. Left sinus exhibits no maxillary sinus tenderness and no frontal sinus tenderness.  Mouth/Throat: Uvula is midline, oropharynx is clear and moist and mucous membranes are normal.  Eyes: Conjunctivae, EOM and lids are  normal. Pupils are equal, round, and reactive to light. No foreign bodies found.  Neck: Trachea normal and normal range of motion. Neck supple. Carotid bruit is not present. No mass and no thyromegaly present.  Cardiovascular: Normal rate, regular rhythm, S1 normal, S2 normal, normal heart sounds, intact distal pulses and normal pulses.  Exam reveals no gallop and no friction rub.   No murmur heard. Pulmonary/Chest: Effort normal and breath sounds normal. Not tachypneic. No respiratory distress. She has no decreased breath sounds. She has no wheezes. She has no rhonchi. She has no rales.  Abdominal: Soft. Normal appearance and bowel sounds are normal. There is no tenderness.  Neurological: She is alert.  Skin: Skin is warm, dry and intact. No rash noted.  Psychiatric: Her speech is normal and behavior is normal. Judgment and thought content normal. Her mood appears not anxious. Cognition and memory are normal. She does not exhibit a depressed mood.          Assessment & Plan:

## 2013-03-14 NOTE — Patient Instructions (Addendum)
Stop at lab on way out. We will call with results. 

## 2013-06-06 ENCOUNTER — Other Ambulatory Visit: Payer: Self-pay | Admitting: *Deleted

## 2013-06-06 MED ORDER — FLUTICASONE PROPIONATE 50 MCG/ACT NA SUSP: 2.0000 | Freq: Every day | NASAL | Status: DC

## 2013-07-26 ENCOUNTER — Ambulatory Visit: Payer: BC Managed Care – PPO | Admitting: Family Medicine

## 2013-07-30 ENCOUNTER — Ambulatory Visit (INDEPENDENT_AMBULATORY_CARE_PROVIDER_SITE_OTHER): Payer: BC Managed Care – PPO | Admitting: Family Medicine

## 2013-07-30 ENCOUNTER — Encounter: Payer: Self-pay | Admitting: Family Medicine

## 2013-07-30 VITALS — BP 120/90 | HR 70 | Temp 98.1°F | Ht 66.75 in | Wt 219.2 lb

## 2013-07-30 DIAGNOSIS — R5381 Other malaise: Secondary | ICD-10-CM

## 2013-07-30 DIAGNOSIS — E039 Hypothyroidism, unspecified: Secondary | ICD-10-CM

## 2013-07-30 DIAGNOSIS — E0590 Thyrotoxicosis, unspecified without thyrotoxic crisis or storm: Secondary | ICD-10-CM

## 2013-07-30 DIAGNOSIS — R7309 Other abnormal glucose: Secondary | ICD-10-CM

## 2013-07-30 DIAGNOSIS — R7303 Prediabetes: Secondary | ICD-10-CM

## 2013-07-30 LAB — COMPREHENSIVE METABOLIC PANEL
ALT: 18 U/L (ref 0–35)
AST: 17 U/L (ref 0–37)
Albumin: 4.1 g/dL (ref 3.5–5.2)
Alkaline Phosphatase: 68 U/L (ref 39–117)
BUN: 11 mg/dL (ref 6–23)
CO2: 26 mEq/L (ref 19–32)
Calcium: 9.1 mg/dL (ref 8.4–10.5)
Chloride: 101 mEq/L (ref 96–112)
Creatinine, Ser: 0.9 mg/dL (ref 0.4–1.2)
GFR: 70.6 mL/min (ref >60.00)
Glucose, Bld: 100 mg/dL — ABNORMAL HIGH (ref 70–99)
Potassium: 4.1 mEq/L (ref 3.5–5.1)
Sodium: 136 mEq/L (ref 135–145)
Total Bilirubin: 0.5 mg/dL (ref 0.3–1.2)
Total Protein: 7.5 g/dL (ref 6.0–8.3)

## 2013-07-30 LAB — CBC WITH DIFFERENTIAL/PLATELET
Basophils Absolute: 0 10*3/uL (ref 0.0–0.1)
Basophils Relative: 0.5 % (ref 0.0–3.0)
Eosinophils Absolute: 0.3 10*3/uL (ref 0.0–0.7)
Eosinophils Relative: 5 % (ref 0.0–5.0)
HCT: 44.7 % (ref 36.0–46.0)
Hemoglobin: 15 g/dL (ref 12.0–15.0)
Lymphocytes Relative: 37.3 % (ref 12.0–46.0)
Lymphs Abs: 2.3 10*3/uL (ref 0.7–4.0)
MCHC: 33.6 g/dL (ref 30.0–36.0)
MCV: 93.1 fl (ref 78.0–100.0)
Monocytes Absolute: 0.5 10*3/uL (ref 0.1–1.0)
Monocytes Relative: 8.7 % (ref 3.0–12.0)
Neutro Abs: 3 10*3/uL (ref 1.4–7.7)
Neutrophils Relative %: 48.5 % (ref 43.0–77.0)
Platelets: 227 10*3/uL (ref 150.0–400.0)
RBC: 4.8 Mil/uL (ref 3.87–5.11)
RDW: 13.7 % (ref 11.5–14.6)
WBC: 6.1 10*3/uL (ref 4.5–10.5)

## 2013-07-30 LAB — TSH: TSH: 3.88 u[IU]/mL (ref 0.35–5.50)

## 2013-07-30 LAB — VITAMIN B12: Vitamin B-12: 327 pg/mL (ref 211–911)

## 2013-07-30 LAB — HEMOGLOBIN A1C: Hgb A1c MFr Bld: 5.3 % (ref 4.6–6.5)

## 2013-07-30 MED ORDER — LEVOTHYROXINE SODIUM 75 MCG PO TABS: 75.0000 ug | ORAL_TABLET | Freq: Every day | ORAL | Status: DC

## 2013-07-30 NOTE — Progress Notes (Signed)
Pre-visit discussion using our clinic review tool. No additional management support is needed unless otherwise documented below in the visit note.  

## 2013-07-30 NOTE — Assessment & Plan Note (Signed)
Will re-eval aith A1C.. DM could be cause of fatigue.

## 2013-07-30 NOTE — Assessment & Plan Note (Addendum)
Doubt this is cause given was normal earlier in the year with similar symtpoms.  Pt requests re-eval.

## 2013-07-30 NOTE — Assessment & Plan Note (Signed)
Will eval with labs. May be secondary to depression or menopausal symptoms. Offered venlafaxine or other medication to treat.. She is not interested at this time. ( Had SE to prozac)

## 2013-07-30 NOTE — Progress Notes (Signed)
   Subjective:    Patient ID: Cindy Mccoy, female    DOB: Jun 17, 1961, 52 y.o.   MRN: [id number]  HPI 52 year old female presents to clinic today to discuss her hypothyroidism.   Lab Results  Component Value Date   TSH 3.77 03/14/2013   She states that she had been feeling well since last OV, energy until last few weeks. She feels like she has not been doing so great in last few weeks. She has been fatigued, melancholy/sadness.  She has had losses in family.  No anxiety.  INsmonia at night.    Wt Readings from Last 3 Encounters:  07/30/13 219 lb 4 oz (99.451 kg)  03/14/13 217 lb 8 oz (98.657 kg)  11/16/12 216 lb (97.977 kg)      Review of Systems  Constitutional: Negative for fever.  HENT: Negative for ear pain.   Eyes: Negative for pain.  Respiratory: Negative for chest tightness and shortness of breath.   Cardiovascular: Negative for chest pain, palpitations and leg swelling.  Gastrointestinal: Negative for abdominal pain.  Genitourinary: Negative for dysuria.       Objective:   Physical Exam  Constitutional: Vital signs are normal. She appears well-developed and well-nourished. She is cooperative.  Non-toxic appearance. She does not appear ill. No distress.  HENT:  Head: Normocephalic.  Right Ear: Hearing, tympanic membrane, external ear and ear canal normal. Tympanic membrane is not erythematous, not retracted and not bulging.  Left Ear: Hearing, tympanic membrane, external ear and ear canal normal. Tympanic membrane is not erythematous, not retracted and not bulging.  Nose: No mucosal edema or rhinorrhea. Right sinus exhibits no maxillary sinus tenderness and no frontal sinus tenderness. Left sinus exhibits no maxillary sinus tenderness and no frontal sinus tenderness.  Mouth/Throat: Uvula is midline, oropharynx is clear and moist and mucous membranes are normal.  Eyes: Conjunctivae, EOM and lids are normal. Pupils are equal, round, and reactive to light. Lids  are everted and swept, no foreign bodies found.  Neck: Trachea normal and normal range of motion. Neck supple. Carotid bruit is not present. No mass and no thyromegaly present.  Cardiovascular: Normal rate, regular rhythm, S1 normal, S2 normal, normal heart sounds, intact distal pulses and normal pulses.  Exam reveals no gallop and no friction rub.   No murmur heard. Pulmonary/Chest: Effort normal and breath sounds normal. Not tachypneic. No respiratory distress. She has no decreased breath sounds. She has no wheezes. She has no rhonchi. She has no rales.  Abdominal: Soft. Normal appearance and bowel sounds are normal. There is no tenderness.  Neurological: She is alert.  Skin: Skin is warm, dry and intact. No rash noted.  Psychiatric: Her speech is normal and behavior is normal. Judgment and thought content normal. Her mood appears not anxious. Cognition and memory are normal. She does not exhibit a depressed mood.          Assessment & Plan:

## 2013-07-30 NOTE — Patient Instructions (Signed)
We will call you with labs result. Work on regular exercise and healthy eating.

## 2013-07-31 LAB — VITAMIN D 25 HYDROXY (VIT D DEFICIENCY, FRACTURES): Vit D, 25-Hydroxy: 39 ng/mL (ref 30–89)

## 2013-08-01 ENCOUNTER — Encounter: Payer: Self-pay | Admitting: *Deleted

## 2013-10-02 ENCOUNTER — Encounter: Payer: Self-pay | Admitting: Internal Medicine

## 2013-10-02 ENCOUNTER — Ambulatory Visit (INDEPENDENT_AMBULATORY_CARE_PROVIDER_SITE_OTHER): Payer: BC Managed Care – PPO | Admitting: Internal Medicine

## 2013-10-02 VITALS — BP 128/88 | HR 76 | Temp 98.1°F | Wt 217.0 lb

## 2013-10-02 DIAGNOSIS — B9789 Other viral agents as the cause of diseases classified elsewhere: Secondary | ICD-10-CM

## 2013-10-02 DIAGNOSIS — J329 Chronic sinusitis, unspecified: Secondary | ICD-10-CM

## 2013-10-02 NOTE — Progress Notes (Signed)
Pre-visit discussion using our clinic review tool. No additional management support is needed unless otherwise documented below in the visit note.  

## 2013-10-02 NOTE — Progress Notes (Signed)
HPI  Pt presents to the clinic today with c/o cough and chest congestion. This started 3 days ago. She reports that she has a burning sensation in her chest. The cough is productive of thick yellow mucous. She denies fever, chills or body aches. She has been taking Dayquil and Nyquil OTC. She denies sick contacts. She is a former smoker.   Review of Systems      Past Medical History  Diagnosis Date  . Anxiety   . Breast mass, left   . History of chicken pox   . Dermatitis     contact  . Pure hypercholesterolemia   . Family history of malignant neoplasm of breast   . Unspecified menopausal and postmenopausal disorder   . Collapsed lung 2005    Family History  Problem Relation Age of Onset  . Cancer Mother     breast  . COPD Mother   . Heart disease Father     CHF    History   Social History  . Marital Status: Married    Spouse Name: N/A    Number of Children: N/A  . Years of Education: N/A   Occupational History  . CNA    Social History Main Topics  . Smoking status: Former Games developermoker  . Smokeless tobacco: Never Used     Comment: 29 pack per year history, quit several years ago  . Alcohol Use: No     Comment: 1-2 drinks per month  . Drug Use: No     Comment: Remote marijuana in highschool- no injections  . Sexual Activity: Not on file   Other Topics Concern  . Not on file   Social History Narrative  . No narrative on file    No Known Allergies   Constitutional: Positive headache, fatigue. Denies fever or abrupt weight changes.  HEENT:  Positive nasal congestion and scratchy throat. Denies eye redness, eye pain, pressure behind the eyes, facial pain, nasal congestion, ear pain, ringing in the ears, wax buildup, runny nose or bloody nose. Respiratory: Positive cough. Denies difficulty breathing or shortness of breath.  Cardiovascular: Denies chest pain, chest tightness, palpitations or swelling in the hands or feet.   No other specific complaints in a  complete review of systems (except as listed in HPI above).  Objective:   BP 128/88  Pulse 76  Temp(Src) 98.1 F (36.7 C) (Oral)  Wt 217 lb (98.431 kg)  SpO2 98%  LMP 09/23/2012 Wt Readings from Last 3 Encounters:  10/02/13 217 lb (98.431 kg)  07/30/13 219 lb 4 oz (99.451 kg)  03/14/13 217 lb 8 oz (98.657 kg)     General: Appears her stated age, well developed, well nourished in NAD. HEENT: Head: normal shape and size; Eyes: sclera white, no icterus, conjunctiva pink, PERRLA and EOMs intact; Ears: Tm's gray and intact, normal light reflex; Nose: mucosa pink and moist, septum midline; Throat/Mouth: + PND. Teeth present, mucosa erythematous and moist, no exudate noted, no lesions or ulcerations noted.  Neck: Mild cervical lymphadenopathy. Neck supple, trachea midline. No massses, lumps or thyromegaly present.  Cardiovascular: Normal rate and rhythm. S1,S2 noted.  No murmur, rubs or gallops noted. No JVD or BLE edema. No carotid bruits noted. Pulmonary/Chest: Normal effort and positive vesicular breath sounds. No respiratory distress. No wheezes, rales or ronchi noted.      Assessment & Plan:   Acute Sinusitis, likely viral:  Get some rest and drink plenty of water Do salt water gargles for the sore throat  Take your flonase OTC daily Encouraged her to also take some Zyrtec OTC  RTC as needed or if symptoms persist.

## 2013-10-02 NOTE — Patient Instructions (Addendum)

## 2013-11-25 ENCOUNTER — Telehealth: Payer: Self-pay | Admitting: Family Medicine

## 2013-11-25 DIAGNOSIS — R7303 Prediabetes: Secondary | ICD-10-CM

## 2013-11-25 DIAGNOSIS — E7800 Pure hypercholesterolemia, unspecified: Secondary | ICD-10-CM

## 2013-11-25 DIAGNOSIS — E039 Hypothyroidism, unspecified: Secondary | ICD-10-CM

## 2013-11-25 NOTE — Telephone Encounter (Signed)
Message copied by Excell SeltzerBEDSOLE, AMY E on Mon Nov 25, 2013 10:20 PM ------      Message from: Alvina ChouWALSH, TERRI J      Created: Fri Nov 15, 2013 12:59 PM      Regarding: Lab orders for Tuesday, 4.7.15       Patient is scheduled for CPX labs, please order future labs, Thanks , Terri       ------

## 2013-11-26 ENCOUNTER — Other Ambulatory Visit: Payer: BC Managed Care – PPO

## 2013-11-26 ENCOUNTER — Telehealth: Payer: Self-pay | Admitting: Family Medicine

## 2013-11-26 NOTE — Telephone Encounter (Signed)
Noted  

## 2013-11-26 NOTE — Telephone Encounter (Signed)
Pt arrived for appointment today and believed that it was her CPE, not her fasting lab appointment.  We offered to r/s to another time, however she did not want another appointment and loudly relayed her displeasure at a previous appointment at our office (with a provider other than Dr. Ermalene SearingBedsole).  When asked what we could do for her today, she stated "nothing, I am getting another doctor".  Appointments canceled for scheduled CPE on 12/03/13.

## 2013-12-03 ENCOUNTER — Encounter: Payer: BC Managed Care – PPO | Admitting: Family Medicine

## 2013-12-18 ENCOUNTER — Ambulatory Visit: Payer: Self-pay | Admitting: Family Medicine

## 2014-09-09 ENCOUNTER — Other Ambulatory Visit: Payer: Self-pay | Admitting: Family Medicine

## 2014-09-09 DIAGNOSIS — Z1231 Encounter for screening mammogram for malignant neoplasm of breast: Secondary | ICD-10-CM

## 2014-09-19 ENCOUNTER — Ambulatory Visit
Admission: RE | Admit: 2014-09-19 | Discharge: 2014-09-19 | Disposition: A | Discharge status: Home / Self Care | Payer: BLUE CROSS/BLUE SHIELD | Source: Ambulatory Visit | Attending: Family Medicine | Admitting: Family Medicine

## 2014-09-19 DIAGNOSIS — Z1231 Encounter for screening mammogram for malignant neoplasm of breast: Secondary | ICD-10-CM

## 2014-09-22 ENCOUNTER — Other Ambulatory Visit: Payer: Self-pay | Admitting: Family Medicine

## 2014-09-22 DIAGNOSIS — R928 Other abnormal and inconclusive findings on diagnostic imaging of breast: Secondary | ICD-10-CM

## 2014-10-01 ENCOUNTER — Ambulatory Visit
Admission: RE | Admit: 2014-10-01 | Discharge: 2014-10-01 | Disposition: A | Discharge status: Home / Self Care | Payer: BLUE CROSS/BLUE SHIELD | Source: Ambulatory Visit | Attending: Family Medicine | Admitting: Family Medicine

## 2014-10-01 ENCOUNTER — Other Ambulatory Visit: Payer: Self-pay | Admitting: Family Medicine

## 2014-10-01 DIAGNOSIS — R928 Other abnormal and inconclusive findings on diagnostic imaging of breast: Secondary | ICD-10-CM

## 2014-10-13 ENCOUNTER — Ambulatory Visit
Admission: RE | Admit: 2014-10-13 | Discharge: 2014-10-13 | Disposition: A | Discharge status: Home / Self Care | Payer: BLUE CROSS/BLUE SHIELD | Source: Ambulatory Visit | Attending: Family Medicine | Admitting: Family Medicine

## 2014-10-13 ENCOUNTER — Other Ambulatory Visit: Payer: Self-pay | Admitting: Family Medicine

## 2014-10-13 DIAGNOSIS — R928 Other abnormal and inconclusive findings on diagnostic imaging of breast: Secondary | ICD-10-CM

## 2014-10-13 HISTORY — PX: BREAST BIOPSY: SHX20

## 2015-03-06 ENCOUNTER — Ambulatory Visit: Payer: Self-pay | Admitting: Family Medicine

## 2015-03-10 ENCOUNTER — Ambulatory Visit: Payer: Self-pay | Admitting: Family Medicine

## 2015-03-27 ENCOUNTER — Ambulatory Visit: Payer: Self-pay | Admitting: Family Medicine

## 2015-03-30 ENCOUNTER — Ambulatory Visit (INDEPENDENT_AMBULATORY_CARE_PROVIDER_SITE_OTHER): Payer: BLUE CROSS/BLUE SHIELD | Admitting: Family Medicine

## 2015-03-30 ENCOUNTER — Encounter: Payer: Self-pay | Admitting: Family Medicine

## 2015-03-30 VITALS — BP 133/90 | HR 60 | Temp 97.9°F | Resp 16 | Ht 67.0 in | Wt 220.6 lb

## 2015-03-30 DIAGNOSIS — R5382 Chronic fatigue, unspecified: Secondary | ICD-10-CM | POA: Diagnosis not present

## 2015-03-30 DIAGNOSIS — E039 Hypothyroidism, unspecified: Secondary | ICD-10-CM

## 2015-03-30 NOTE — Patient Instructions (Signed)
Continue current meds 

## 2015-03-30 NOTE — Progress Notes (Signed)
Name: Cindy Mccoy   MRN: [id number]    DOB: 1961-04-29   Date:03/30/2015       Progress Note  Subjective  Chief Complaint  Chief Complaint  Patient presents with  . Hypothyroidism    thyroid    HPI  Here concerned re: thyroid.  Thinks she is not on enough thyroid med.  She is fatigued, gaining weight, hair thinning.   Taking thyroid with coffee.  Has had some depressive sx (PHQ-9 score of 6).  She said she has had some disappointments. No problem-specific assessment & plan notes found for this encounter.   Past Medical History  Diagnosis Date  . Anxiety   . Breast mass, left   . History of chicken pox   . Dermatitis     contact  . Pure hypercholesterolemia   . Family history of malignant neoplasm of breast   . Unspecified menopausal and postmenopausal disorder   . Collapsed lung 2005    History  Substance Use Topics  . Smoking status: Former Games developer  . Smokeless tobacco: Never Used     Comment: 29 pack per year history, quit several years ago  . Alcohol Use: No     Comment: 1-2 drinks per month     Current outpatient prescriptions:  .  fluticasone (FLONASE) 50 MCG/ACT nasal spray, Place 2 sprays into the nose daily., Disp: 16 g, Rfl: 5 .  levothyroxine (SYNTHROID) 75 MCG tablet, Take 1 tablet (75 mcg total) by mouth daily., Disp: 30 tablet, Rfl: 11 .  Multiple Vitamin (MULTIVITAMIN) tablet, Take 1 tablet by mouth daily., Disp: , Rfl:  .  omeprazole (PRILOSEC) 20 MG capsule, , Disp: , Rfl:   No Known Allergies  Review of Systems  Constitutional: Positive for malaise/fatigue. Negative for fever, chills and weight loss.  HENT: Negative for hearing loss.   Eyes: Negative for blurred vision and double vision.  Respiratory: Negative for cough, sputum production, shortness of breath and wheezing.   Cardiovascular: Negative for chest pain, palpitations, orthopnea and leg swelling.  Gastrointestinal: Negative for heartburn, nausea, vomiting, abdominal pain, diarrhea and  blood in stool.  Genitourinary: Negative for dysuria, urgency and frequency.  Musculoskeletal: Positive for myalgias. Negative for joint pain.  Skin: Negative for rash.  Neurological: Positive for weakness. Negative for dizziness, tingling, sensory change, focal weakness and headaches.  Psychiatric/Behavioral: Positive for depression. Negative for substance abuse. The patient has insomnia. The patient is not nervous/anxious.       Objective  Filed Vitals:   03/30/15 1308  BP: 133/90  Pulse: 60  Temp: 97.9 F (36.6 C)  TempSrc: Oral  Resp: 16  Height:  (1.702 m)  Weight: 220 lb 9.6 oz (100.064 kg)     Physical Exam  Constitutional: She is well-developed, well-nourished, and in no distress.  HENT:  Head: Normocephalic and atraumatic.  Eyes: Conjunctivae and EOM are normal. Pupils are equal, round, and reactive to light. No scleral icterus.  Neck: Normal range of motion. Neck supple. No thyromegaly present.  Cardiovascular: Normal rate, regular rhythm, normal heart sounds and intact distal pulses.  Exam reveals no gallop and no friction rub.   No murmur heard. Pulmonary/Chest: Breath sounds normal. No respiratory distress. She has no wheezes. She has no rales.  Abdominal: Soft. Bowel sounds are normal. She exhibits no distension and no mass. There is no tenderness.  Musculoskeletal: She exhibits no edema.  Lymphadenopathy:    She has no cervical adenopathy.  Skin: Skin is warm and dry.  Psychiatric:  Affect mildly depressed.  Vitals reviewed.     No results found for this or any previous visit (from the past 2160 hour(s)).   Assessment & Plan  1. Chronic fatigue  - Comprehensive Metabolic Panel (CMET) - CBC with Differential - Vitamin D (25 hydroxy)  2. Hypothyroidism, unspecified hypothyroidism type  - TSH

## 2015-04-01 ENCOUNTER — Telehealth: Payer: Self-pay | Admitting: Family Medicine

## 2015-04-01 NOTE — Telephone Encounter (Signed)
Pt said on her medication list on summary has 75 micrograms of levothyroxine and it had already been increased to 88 micrograms.  Her call back number is [phone]

## 2015-04-01 NOTE — Telephone Encounter (Signed)
Amy I see that this patient has this med but do not think we wrote it ever. I think it was just a med that was on her list and maybe she just agreed when going over meds. Please review.[facility]

## 2015-04-02 ENCOUNTER — Other Ambulatory Visit: Payer: Self-pay | Admitting: *Deleted

## 2015-04-02 DIAGNOSIS — E0590 Thyrotoxicosis, unspecified without thyrotoxic crisis or storm: Secondary | ICD-10-CM

## 2015-04-02 LAB — CBC WITH DIFFERENTIAL/PLATELET
Basophils Absolute: 0 10*3/uL (ref 0.0–0.2)
Basos: 0 %
EOS (ABSOLUTE): 0.3 10*3/uL (ref 0.0–0.4)
Eos: 5 %
Hematocrit: 45.1 % (ref 34.0–46.6)
Hemoglobin: 15.1 g/dL (ref 11.1–15.9)
Immature Grans (Abs): 0 10*3/uL (ref 0.0–0.1)
Immature Granulocytes: 0 %
Lymphocytes Absolute: 1.5 10*3/uL (ref 0.7–3.1)
Lymphs: 29 %
MCH: 30.2 pg (ref 26.6–33.0)
MCHC: 33.5 g/dL (ref 31.5–35.7)
MCV: 90 fL (ref 79–97)
Monocytes Absolute: 0.4 10*3/uL (ref 0.1–0.9)
Monocytes: 7 %
Neutrophils Absolute: 3 10*3/uL (ref 1.4–7.0)
Neutrophils: 59 %
Platelets: 205 10*3/uL (ref 150–379)
RBC: 5 x10E6/uL (ref 3.77–5.28)
RDW: 14 % (ref 12.3–15.4)
WBC: 5.2 10*3/uL (ref 3.4–10.8)

## 2015-04-02 LAB — COMPREHENSIVE METABOLIC PANEL
ALT: 18 IU/L (ref 0–32)
AST: 15 IU/L (ref 0–40)
Albumin/Globulin Ratio: 1.6 (ref 1.1–2.5)
Albumin: 4 g/dL (ref 3.5–5.5)
Alkaline Phosphatase: 82 IU/L (ref 39–117)
BUN/Creatinine Ratio: 15 (ref 9–23)
BUN: 12 mg/dL (ref 6–24)
Bilirubin Total: 0.4 mg/dL (ref 0.0–1.2)
CO2: 25 mmol/L (ref 18–29)
Calcium: 9.2 mg/dL (ref 8.7–10.2)
Chloride: 102 mmol/L (ref 97–108)
Creatinine, Ser: 0.8 mg/dL (ref 0.57–1.00)
GFR calc Af Amer: 97 mL/min/{1.73_m2} (ref >59)
GFR calc non Af Amer: 84 mL/min/{1.73_m2} (ref >59)
Globulin, Total: 2.5 g/dL (ref 1.5–4.5)
Glucose: 101 mg/dL — ABNORMAL HIGH (ref 65–99)
Potassium: 4.5 mmol/L (ref 3.5–5.2)
Sodium: 141 mmol/L (ref 134–144)
Total Protein: 6.5 g/dL (ref 6.0–8.5)

## 2015-04-02 LAB — VITAMIN D 25 HYDROXY (VIT D DEFICIENCY, FRACTURES): Vit D, 25-Hydroxy: 36.4 ng/mL (ref 30.0–100.0)

## 2015-04-02 LAB — TSH: TSH: 3.1 u[IU]/mL (ref 0.450–4.500)

## 2015-04-02 MED ORDER — LEVOTHYROXINE SODIUM 88 MCG PO TABS: 88.0000 ug | ORAL_TABLET | Freq: Every day | ORAL | Status: DC

## 2015-04-02 NOTE — Progress Notes (Signed)
LMCOB. X1 [place]

## 2015-04-02 NOTE — Telephone Encounter (Signed)
R/T patient call and notified that our system show 75 mcg is what she should be taking. In May 2016 patient was on 88 mcg of Levothyroxine. Amy Bedsole did write for 88 mcg. Patient may call office.

## 2015-04-02 NOTE — Telephone Encounter (Signed)
I have never seen this patient. She is a Dr. Juanetta Gosling patient. From her [employer] chart- it looks like he sent in of levothyroxine with 2 refills on 5/13. I don't see where it was ever written for . Does she need refills?

## 2015-04-02 NOTE — Telephone Encounter (Signed)
Discussed thyroid medication and sent levothyroxine 88 mcg.

## 2015-04-03 LAB — HGB A1C W/O EAG: Hgb A1c MFr Bld: 5.6 % (ref 4.8–5.6)

## 2015-04-06 NOTE — Progress Notes (Signed)
Mailed.JH  

## 2015-04-24 LAB — SPECIMEN STATUS REPORT

## 2015-06-01 ENCOUNTER — Ambulatory Visit: Payer: BLUE CROSS/BLUE SHIELD | Admitting: Family Medicine

## 2015-06-04 ENCOUNTER — Encounter: Payer: Self-pay | Admitting: Family Medicine

## 2015-06-04 ENCOUNTER — Ambulatory Visit (INDEPENDENT_AMBULATORY_CARE_PROVIDER_SITE_OTHER): Payer: BLUE CROSS/BLUE SHIELD | Admitting: Family Medicine

## 2015-06-04 VITALS — BP 129/89 | HR 68 | Temp 97.9°F | Resp 16 | Ht 67.0 in | Wt 221.0 lb

## 2015-06-04 DIAGNOSIS — Z23 Encounter for immunization: Secondary | ICD-10-CM | POA: Diagnosis not present

## 2015-06-04 DIAGNOSIS — E039 Hypothyroidism, unspecified: Secondary | ICD-10-CM

## 2015-06-04 NOTE — Progress Notes (Signed)
Name: Cindy RockerBarbara Mccoy   MRN: [id number]    DOB: 1961-05-22   Date:06/04/2015       Progress Note  Subjective  Chief Complaint  Chief Complaint  Patient presents with  . Hypothyroidism    HPI Here for f/u of thyroid disease.  Taking 88 mcg of Levothyroxine.  Feels well.  Last TSH was 2 months ago was 3.1.  No problem-specific assessment & plan notes found for this encounter.   Past Medical History  Diagnosis Date  . Anxiety   . Breast mass, left   . History of chicken pox   . Dermatitis     contact  . Pure hypercholesterolemia   . Family history of malignant neoplasm of breast   . Unspecified menopausal and postmenopausal disorder   . Collapsed lung 2005    Social History  Substance Use Topics  . Smoking status: Former Games developermoker  . Smokeless tobacco: Never Used     Comment: 29 pack per year history, quit several years ago  . Alcohol Use: No     Comment: 1-2 drinks per month     Current outpatient prescriptions:  .  fluticasone (FLONASE) 50 MCG/ACT nasal spray, Place 2 sprays into the nose daily., Disp: 16 g, Rfl: 5 .  levothyroxine (SYNTHROID, LEVOTHROID) 88 MCG tablet, Take 1 tablet (88 mcg total) by mouth daily., Disp: 90 tablet, Rfl: 0 .  Multiple Vitamin (MULTIVITAMIN) tablet, Take 1 tablet by mouth daily., Disp: , Rfl:  .  omeprazole (PRILOSEC) 20 MG capsule, , Disp: , Rfl:   No Known Allergies  Review of Systems  Constitutional: Negative for fever, chills, weight loss and malaise/fatigue.  HENT: Negative for hearing loss.   Eyes: Negative for blurred vision and double vision.  Respiratory: Negative for cough, sputum production, shortness of breath and wheezing.   Cardiovascular: Negative for chest pain, palpitations, orthopnea and leg swelling.  Gastrointestinal: Negative for heartburn, abdominal pain and blood in stool.  Genitourinary: Negative for dysuria, urgency and frequency.  Musculoskeletal: Negative for myalgias and joint pain.  Skin: Negative for  rash.  Neurological: Negative for dizziness, weakness and headaches.  Psychiatric/Behavioral: Negative for depression. The patient is not nervous/anxious.       Objective  Filed Vitals:   06/04/15 1051  BP: 129/89  Pulse: 68  Temp: 97.9 F (36.6 C)  TempSrc: Oral  Resp: 16  Height: 5\' 7"  (1.702 m)  Weight: 221 lb (100.245 kg)     Physical Exam  Constitutional: She is oriented to person, place, and time and well-developed, well-nourished, and in no distress. No distress.  HENT:  Head: Normocephalic and atraumatic.  Eyes: Conjunctivae and EOM are normal. Pupils are equal, round, and reactive to light. No scleral icterus.  Neck: Normal range of motion. Neck supple. Carotid bruit is not present. No thyromegaly present.  Cardiovascular: Normal rate, regular rhythm, normal heart sounds and intact distal pulses.  Exam reveals no gallop and no friction rub.   No murmur heard. Pulmonary/Chest: Effort normal. No respiratory distress. She has no wheezes. She has no rales.  Abdominal: Soft. Bowel sounds are normal. She exhibits no distension and no mass. There is no tenderness.  Musculoskeletal: She exhibits no edema.  Lymphadenopathy:    She has no cervical adenopathy.  Neurological: She is alert and oriented to person, place, and time.  Vitals reviewed.     Recent Results (from the past 2160 hour(s))  Comprehensive Metabolic Panel (CMET)     Status: Abnormal   Collection Time:  04/01/15  9:24 AM  Result Value Ref Range   Glucose 101 (H) 65 - 99 mg/dL   BUN 12 6 - 24 mg/dL   Creatinine, Ser 5.18 0.57 - 1.00 mg/dL   GFR calc non Af Amer 84 >59 mL/min/1.73   GFR calc Af Amer 97 >59 mL/min/1.73   BUN/Creatinine Ratio 15 9 - 23   Sodium 141 134 - 144 mmol/L   Potassium 4.5 3.5 - 5.2 mmol/L   Chloride 102 97 - 108 mmol/L   CO2 25 18 - 29 mmol/L   Calcium 9.2 8.7 - 10.2 mg/dL   Total Protein 6.5 6.0 - 8.5 g/dL   Albumin 4.0 3.5 - 5.5 g/dL   Globulin, Total 2.5 1.5 - 4.5 g/dL    Albumin/Globulin Ratio 1.6 1.1 - 2.5   Bilirubin Total 0.4 0.0 - 1.2 mg/dL   Alkaline Phosphatase 82 39 - 117 IU/L   AST 15 0 - 40 IU/L   ALT 18 0 - 32 IU/L  CBC with Differential     Status: None   Collection Time: 04/01/15  9:24 AM  Result Value Ref Range   WBC 5.2 3.4 - 10.8 x10E3/uL   RBC 5.00 3.77 - 5.28 x10E6/uL   Hemoglobin 15.1 11.1 - 15.9 g/dL   Hematocrit 84.1 66.0 - 46.6 %   MCV 90 79 - 97 fL   MCH 30.2 26.6 - 33.0 pg   MCHC 33.5 31.5 - 35.7 g/dL   RDW 63.0 16.0 - 10.9 %   Platelets 205 150 - 379 x10E3/uL   Neutrophils 59 %   Lymphs 29 %   Monocytes 7 %   Eos 5 %   Basos 0 %   Neutrophils Absolute 3.0 1.4 - 7.0 x10E3/uL   Lymphocytes Absolute 1.5 0.7 - 3.1 x10E3/uL   Monocytes Absolute 0.4 0.1 - 0.9 x10E3/uL   EOS (ABSOLUTE) 0.3 0.0 - 0.4 x10E3/uL   Basophils Absolute 0.0 0.0 - 0.2 x10E3/uL   Immature Granulocytes 0 %   Immature Grans (Abs) 0.0 0.0 - 0.1 x10E3/uL  TSH     Status: None   Collection Time: 04/01/15  9:24 AM  Result Value Ref Range   TSH 3.100 0.450 - 4.500 uIU/mL  Vitamin D (25 hydroxy)     Status: None   Collection Time: 04/01/15  9:24 AM  Result Value Ref Range   Vit D, 25-Hydroxy 36.4 30.0 - 100.0 ng/mL    Comment: Vitamin D deficiency has been defined by the Institute of Medicine and an Endocrine Society practice guideline as a level of serum 25-OH vitamin D less than 20 ng/mL (1,2). The Endocrine Society went on to further define vitamin D insufficiency as a level between 21 and 29 ng/mL (2). 1. IOM (Institute of Medicine). 2010. Dietary reference    intakes for calcium and D. [place]: The    [employer]. 2. Holick MF, Binkley [place], Bischoff-Ferrari HA, et al.    Evaluation, treatment, and prevention of vitamin D    deficiency: an Endocrine Society clinical practice    guideline. JCEM. 2011 Jul; 96(7):1911-30.   Hgb A1c w/o eAG     Status: None   Collection Time: 04/01/15  9:24 AM  Result Value Ref Range   Hgb A1c  MFr Bld 5.6 4.8 - 5.6 %    Comment:          Pre-diabetes: 5.7 - 6.4          Diabetes: >6.4  Glycemic control for adults with diabetes: <7.0   Specimen status report     Status: None   Collection Time: 04/01/15  9:24 AM  Result Value Ref Range   specimen status report Comment     Comment: Written Authorization Written Authorization No Written Authorization Received.      Assessment & Plan  1. Hypothyroidism, unspecified hypothyroidism type -Cont. Levothyroxine at same dose.  2. Need for influenza vaccination  - Flu Vaccine QUAD 36+ mos PF IM (Fluarix & Fluzone Quad PF)

## 2015-06-04 NOTE — Patient Instructions (Signed)
Plan TSH on return

## 2015-07-11 ENCOUNTER — Other Ambulatory Visit: Payer: Self-pay | Admitting: Family Medicine

## 2015-09-28 ENCOUNTER — Other Ambulatory Visit: Payer: Self-pay

## 2015-09-28 DIAGNOSIS — Z1231 Encounter for screening mammogram for malignant neoplasm of breast: Secondary | ICD-10-CM

## 2015-10-12 ENCOUNTER — Ambulatory Visit
Admission: RE | Admit: 2015-10-12 | Discharge: 2015-10-12 | Disposition: A | Discharge status: Home / Self Care | Payer: BLUE CROSS/BLUE SHIELD | Source: Ambulatory Visit

## 2015-10-12 DIAGNOSIS — Z1231 Encounter for screening mammogram for malignant neoplasm of breast: Secondary | ICD-10-CM

## 2015-10-12 DIAGNOSIS — N63 Unspecified lump in unspecified breast: Secondary | ICD-10-CM

## 2015-10-22 ENCOUNTER — Other Ambulatory Visit: Payer: Self-pay | Admitting: *Deleted

## 2015-10-22 ENCOUNTER — Encounter: Payer: Self-pay | Admitting: Family Medicine

## 2015-10-22 ENCOUNTER — Ambulatory Visit (INDEPENDENT_AMBULATORY_CARE_PROVIDER_SITE_OTHER): Payer: BLUE CROSS/BLUE SHIELD | Admitting: Family Medicine

## 2015-10-22 VITALS — BP 123/80 | HR 71 | Resp 16 | Ht 67.0 in | Wt 225.2 lb

## 2015-10-22 DIAGNOSIS — K219 Gastro-esophageal reflux disease without esophagitis: Secondary | ICD-10-CM | POA: Diagnosis not present

## 2015-10-22 DIAGNOSIS — E038 Other specified hypothyroidism: Secondary | ICD-10-CM

## 2015-10-22 DIAGNOSIS — E7800 Pure hypercholesterolemia, unspecified: Secondary | ICD-10-CM | POA: Diagnosis not present

## 2015-10-22 DIAGNOSIS — E034 Atrophy of thyroid (acquired): Secondary | ICD-10-CM | POA: Diagnosis not present

## 2015-10-22 DIAGNOSIS — Z1231 Encounter for screening mammogram for malignant neoplasm of breast: Secondary | ICD-10-CM

## 2015-10-22 DIAGNOSIS — N63 Unspecified lump in unspecified breast: Secondary | ICD-10-CM

## 2015-10-22 DIAGNOSIS — Z0000 Encounter for general adult medical examination without abnormal findings: Secondary | ICD-10-CM | POA: Diagnosis not present

## 2015-10-22 MED ORDER — LEVOTHYROXINE SODIUM 88 MCG PO TABS: 88.0000 ug | ORAL_TABLET | Freq: Every day | ORAL | Status: DC

## 2015-10-22 MED ORDER — OMEPRAZOLE 20 MG PO CPDR: 20.0000 mg | DELAYED_RELEASE_CAPSULE | Freq: Every day | ORAL | Status: DC

## 2015-10-22 NOTE — Progress Notes (Signed)
Name: Cindy Mccoy   MRN: [id number]    DOB: 03/08/1961   Date:10/22/2015       Progress Note  Subjective  Chief Complaint  Chief Complaint  Patient presents with  . Annual Exam    HPI Here for yearly complete female physical.  Has had abnormal mammogram and work up in past.  Had question of a distortion of R breast on mammogram done  10/12/15.  Needs diagnostic mammogram and [place] of R breast.  Feels preety good overall.  Does have recurrence of GERD sx. If off Omeprazole for very long.  Hypothyroid. No problem-specific assessment & plan notes found for this encounter.   Past Medical History  Diagnosis Date  . Anxiety   . Breast mass, left   . History of chicken pox   . Dermatitis     contact  . Pure hypercholesterolemia   . Family history of malignant neoplasm of breast   . Unspecified menopausal and postmenopausal disorder   . Collapsed lung 2005    Past Surgical History  Procedure Laterality Date  . Breast biopsy  1981    Family History  Problem Relation Age of Onset  . Cancer Mother     breast  . COPD Mother   . Heart disease Father     CHF    Social History   Social History  . Marital Status: Married    Spouse Name: N/A  . Number of Children: N/A  . Years of Education: N/A   Occupational History  . CNA    Social History Main Topics  . Smoking status: Former Games developer  . Smokeless tobacco: Never Used     Comment: 29 pack per year history, quit several years ago  . Alcohol Use: No     Comment: 1-2 drinks per month  . Drug Use: No     Comment: Remote marijuana in highschool- no injections  . Sexual Activity: Not on file   Other Topics Concern  . Not on file   Social History Narrative     Current outpatient prescriptions:  .  fluticasone (FLONASE) 50 MCG/ACT nasal spray, Place 2 sprays into the nose daily., Disp: 16 g, Rfl: 5 .  levothyroxine (SYNTHROID, LEVOTHROID) 88 MCG tablet, Take 1 tablet (88 mcg total) by mouth daily., Disp: 90 tablet,  Rfl: 3 .  Multiple Vitamin (MULTIVITAMIN) tablet, Take 1 tablet by mouth daily., Disp: , Rfl:  .  omeprazole (PRILOSEC) 20 MG capsule, Take 1 capsule (20 mg total) by mouth daily., Disp: 90 capsule, Rfl: 3  Not on File   Review of Systems  Constitutional: Negative for fever, chills, weight loss and malaise/fatigue.  HENT: Negative for hearing loss.   Eyes: Negative for blurred vision and double vision.  Respiratory: Negative for cough, shortness of breath and wheezing.   Cardiovascular: Negative for chest pain, palpitations and leg swelling.  Gastrointestinal: Negative for heartburn, abdominal pain and blood in stool.  Genitourinary: Negative for dysuria, urgency and frequency.  Musculoskeletal: Negative for myalgias and joint pain.  Skin: Negative for rash.  Neurological: Negative for dizziness, tremors, weakness and headaches.  Psychiatric/Behavioral: Negative for depression. The patient is not nervous/anxious and does not have insomnia.       Objective  Filed Vitals:   10/22/15 1424  BP: 123/80  Pulse: 71  Resp: 16  Height:  (1.702 m)  Weight: 225 lb 3.2 oz (102.15 kg)  SpO2: 99%    Physical Exam  Constitutional: She is oriented to person,  place, and time and well-developed, well-nourished, and in no distress. No distress.  HENT:  Head: Normocephalic and atraumatic.  Right Ear: External ear normal.  Left Ear: External ear normal.  Nose: Nose normal.  Mouth/Throat: Oropharynx is clear and moist.  Eyes: Conjunctivae and EOM are normal. Pupils are equal, round, and reactive to light. No scleral icterus.  Neck: Normal range of motion. Neck supple. Carotid bruit is not present. No thyromegaly present.  Cardiovascular: Normal rate, regular rhythm and normal heart sounds.  Exam reveals no gallop and no friction rub.   No murmur heard. Pulmonary/Chest: Effort normal and breath sounds normal. No respiratory distress. She has no wheezes. She has no rales.  Abdominal:  Soft. Bowel sounds are normal. She exhibits no distension. There is no tenderness. There is no guarding.  obese  Genitourinary: Vagina normal, uterus normal, cervix normal, right adnexa normal and left adnexa normal.  Musculoskeletal: She exhibits no edema.  Lymphadenopathy:    She has no cervical adenopathy.  Neurological: She is alert and oriented to person, place, and time. No cranial nerve deficit. Coordination normal.  Psychiatric: Mood, memory, affect and judgment normal.  Vitals reviewed.      No results found for this or any previous visit (from the past 2160 hour(s)).   Assessment & Plan  Problem List Items Addressed This Visit      Digestive   GERD (gastroesophageal reflux disease)   Relevant Medications   omeprazole (PRILOSEC) 20 MG capsule   Other Relevant Orders   CBC with Differential     Endocrine   Hypothyroidism   Relevant Medications   levothyroxine (SYNTHROID, LEVOTHROID) 88 MCG tablet   Other Relevant Orders   TSH     Other   PURE HYPERCHOLESTEROLEMIA   Relevant Orders   Comprehensive Metabolic Panel (CMET)   Lipid Profile   Health maintenance examination - Primary      Meds ordered this encounter  Medications  . levothyroxine (SYNTHROID, LEVOTHROID) 88 MCG tablet    Sig: Take 1 tablet (88 mcg total) by mouth daily.    Dispense:  90 tablet    Refill:  3  . omeprazole (PRILOSEC) 20 MG capsule    Sig: Take 1 capsule (20 mg total) by mouth daily.    Dispense:  90 capsule    Refill:  3   1. Health maintenance examination   2. Hypothyroidism due to acquired atrophy of thyroid  - levothyroxine (SYNTHROID, LEVOTHROID) 88 MCG tablet; Take 1 tablet (88 mcg total) by mouth daily.  Dispense: 90 tablet; Refill: 3 - TSH  3. Pure hypercholesterolemia  - Comprehensive Metabolic Panel (CMET) - Lipid Profile  4. Gastroesophageal reflux disease without esophagitis  - omeprazole (PRILOSEC) 20 MG capsule; Take 1 capsule (20 mg total) by mouth  daily.  Dispense: 90 capsule; Refill: 3 - CBC with Differential

## 2015-10-22 NOTE — Addendum Note (Signed)
Addended by: Clayborne Artist A on: 10/22/2015 03:47 PM   Modules accepted: Orders, SmartSet

## 2015-10-23 ENCOUNTER — Other Ambulatory Visit: Payer: Self-pay | Admitting: Family Medicine

## 2015-10-23 DIAGNOSIS — R928 Other abnormal and inconclusive findings on diagnostic imaging of breast: Secondary | ICD-10-CM

## 2015-10-26 LAB — PAP IG AND HPV HIGH-RISK
HPV, high-risk: NEGATIVE
PAP Smear Comment: 0

## 2015-11-02 ENCOUNTER — Ambulatory Visit
Admission: RE | Admit: 2015-11-02 | Discharge: 2015-11-02 | Disposition: A | Discharge status: Home / Self Care | Payer: BLUE CROSS/BLUE SHIELD | Source: Ambulatory Visit | Attending: Family Medicine | Admitting: Family Medicine

## 2015-11-02 DIAGNOSIS — R928 Other abnormal and inconclusive findings on diagnostic imaging of breast: Secondary | ICD-10-CM

## 2015-11-07 LAB — LIPID PANEL
Chol/HDL Ratio: 3.9 ratio units (ref 0.0–4.4)
Cholesterol, Total: 186 mg/dL (ref 100–199)
HDL: 48 mg/dL (ref >39)
LDL Calculated: 112 mg/dL — ABNORMAL HIGH (ref 0–99)
Triglycerides: 131 mg/dL (ref 0–149)
VLDL Cholesterol Cal: 26 mg/dL (ref 5–40)

## 2015-11-07 LAB — COMPREHENSIVE METABOLIC PANEL
ALT: 22 IU/L (ref 0–32)
AST: 18 IU/L (ref 0–40)
Albumin/Globulin Ratio: 1.6 (ref 1.2–2.2)
Albumin: 3.9 g/dL (ref 3.5–5.5)
Alkaline Phosphatase: 78 IU/L (ref 39–117)
BUN/Creatinine Ratio: 16 (ref 9–23)
BUN: 13 mg/dL (ref 6–24)
Bilirubin Total: 0.4 mg/dL (ref 0.0–1.2)
CO2: 25 mmol/L (ref 18–29)
Calcium: 9 mg/dL (ref 8.7–10.2)
Chloride: 102 mmol/L (ref 96–106)
Creatinine, Ser: 0.8 mg/dL (ref 0.57–1.00)
GFR calc Af Amer: 97 mL/min/{1.73_m2} (ref >59)
GFR calc non Af Amer: 84 mL/min/{1.73_m2} (ref >59)
Globulin, Total: 2.5 g/dL (ref 1.5–4.5)
Glucose: 102 mg/dL — ABNORMAL HIGH (ref 65–99)
Potassium: 4.3 mmol/L (ref 3.5–5.2)
Sodium: 141 mmol/L (ref 134–144)
Total Protein: 6.4 g/dL (ref 6.0–8.5)

## 2015-11-07 LAB — CBC WITH DIFFERENTIAL/PLATELET
Basophils Absolute: 0 10*3/uL (ref 0.0–0.2)
Basos: 1 %
EOS (ABSOLUTE): 0.3 10*3/uL (ref 0.0–0.4)
Eos: 5 %
Hematocrit: 43.4 % (ref 34.0–46.6)
Hemoglobin: 14.7 g/dL (ref 11.1–15.9)
Immature Grans (Abs): 0 10*3/uL (ref 0.0–0.1)
Immature Granulocytes: 0 %
Lymphocytes Absolute: 1.8 10*3/uL (ref 0.7–3.1)
Lymphs: 32 %
MCH: 30.6 pg (ref 26.6–33.0)
MCHC: 33.9 g/dL (ref 31.5–35.7)
MCV: 90 fL (ref 79–97)
Monocytes Absolute: 0.6 10*3/uL (ref 0.1–0.9)
Monocytes: 11 %
Neutrophils Absolute: 2.9 10*3/uL (ref 1.4–7.0)
Neutrophils: 51 %
Platelets: 212 10*3/uL (ref 150–379)
RBC: 4.81 x10E6/uL (ref 3.77–5.28)
RDW: 14.5 % (ref 12.3–15.4)
WBC: 5.7 10*3/uL (ref 3.4–10.8)

## 2015-11-07 LAB — TSH: TSH: 8.36 u[IU]/mL — ABNORMAL HIGH (ref 0.450–4.500)

## 2015-11-09 MED ORDER — LEVOTHYROXINE SODIUM 100 MCG PO TABS: 100.0000 ug | ORAL_TABLET | Freq: Every day | ORAL | Status: DC

## 2015-11-09 NOTE — Addendum Note (Signed)
Addended by: Alease FrameARTER, Eden Rho S on: 11/09/2015 02:09 PM   Modules accepted: Orders

## 2015-12-01 LAB — SPECIMEN STATUS REPORT

## 2015-12-01 LAB — HGB A1C W/O EAG: Hgb A1c MFr Bld: 5.6 % (ref 4.8–5.6)

## 2015-12-10 ENCOUNTER — Ambulatory Visit: Payer: BLUE CROSS/BLUE SHIELD | Admitting: Family Medicine

## 2015-12-21 ENCOUNTER — Ambulatory Visit (INDEPENDENT_AMBULATORY_CARE_PROVIDER_SITE_OTHER): Payer: BLUE CROSS/BLUE SHIELD | Admitting: Family Medicine

## 2015-12-21 ENCOUNTER — Encounter: Payer: Self-pay | Admitting: Family Medicine

## 2015-12-21 VITALS — BP 130/85 | HR 72 | Temp 98.3°F | Resp 16 | Ht 67.0 in | Wt 224.0 lb

## 2015-12-21 DIAGNOSIS — K219 Gastro-esophageal reflux disease without esophagitis: Secondary | ICD-10-CM

## 2015-12-21 DIAGNOSIS — E034 Atrophy of thyroid (acquired): Secondary | ICD-10-CM

## 2015-12-21 DIAGNOSIS — E038 Other specified hypothyroidism: Secondary | ICD-10-CM

## 2015-12-21 NOTE — Progress Notes (Signed)
Name: Cindy Mccoy   MRN: [id number]    DOB: [birth year]   Date:12/21/2015       Progress Note  Subjective  Chief Complaint  Chief Complaint  Patient presents with  . Hypothyroidism    HPI Here for f/u of hypothyroidism. She increased dose of Levothyroxine to 100 mcg/d as directed for the past 6 weeks.  Has not really lost any weight.  No problem-specific assessment & plan notes found for this encounter.   Past Medical History  Diagnosis Date  . Anxiety   . Breast mass, left   . History of chicken pox   . Dermatitis     contact  . Pure hypercholesterolemia   . Family history of malignant neoplasm of breast   . Unspecified menopausal and postmenopausal disorder   . Collapsed lung 2005    Past Surgical History  Procedure Laterality Date  . Breast biopsy  1981    Family History  Problem Relation Age of Onset  . Cancer Mother     breast  . COPD Mother   . Heart disease Father     CHF    Social History   Social History  . Marital Status: Married    Spouse Name: N/A  . Number of Children: N/A  . Years of Education: N/A   Occupational History  . CNA    Social History Main Topics  . Smoking status: Former Games developer  . Smokeless tobacco: Never Used     Comment: 29 pack per year history, quit several years ago  . Alcohol Use: No     Comment: 1-2 drinks per month  . Drug Use: No     Comment: Remote marijuana in highschool- no injections  . Sexual Activity: Not on file   Other Topics Concern  . Not on file   Social History Narrative     Current outpatient prescriptions:  .  fluticasone (FLONASE) 50 MCG/ACT nasal spray, Place 2 sprays into the nose daily., Disp: 16 g, Rfl: 5 .  levothyroxine (SYNTHROID, LEVOTHROID) 100 MCG tablet, Take 1 tablet (100 mcg total) by mouth daily., Disp: 30 tablet, Rfl: 6 .  Multiple Vitamin (MULTIVITAMIN) tablet, Take 1 tablet by mouth daily., Disp: , Rfl:  .  omeprazole (PRILOSEC) 20 MG capsule, Take 1 capsule (20 mg total) by  mouth daily., Disp: 90 capsule, Rfl: 3  Not on File   Review of Systems  Constitutional: Negative for fever, chills, weight loss and malaise/fatigue.  HENT: Negative for hearing loss.   Eyes: Negative for blurred vision and double vision.  Respiratory: Negative for cough, shortness of breath and wheezing.   Cardiovascular: Negative for chest pain, palpitations and leg swelling.  Gastrointestinal: Negative for heartburn, abdominal pain and blood in stool.  Genitourinary: Negative for dysuria, urgency and frequency.  Skin: Negative for rash.  Neurological: Positive for weakness (weak in hips, ankles, feet). Negative for dizziness, tremors and headaches.  Psychiatric/Behavioral: Negative for depression. The patient is not nervous/anxious and does not have insomnia.       Objective  Filed Vitals:   12/21/15 1426 12/21/15 1455  BP: 149/85 130/85  Pulse: 72   Temp: 98.3 F (36.8 C)   TempSrc: Oral   Resp: 16   Height:  (1.702 m)   Weight: 224 lb (101.606 kg)     Physical Exam  Constitutional: She is oriented to person, place, and time and well-developed, well-nourished, and in no distress. No distress.  HENT:  Head: Normocephalic and atraumatic.  Eyes: Conjunctivae and EOM are normal. Pupils are equal, round, and reactive to light. No scleral icterus.  Neck: Normal range of motion. Neck supple. Carotid bruit is not present. No thyromegaly present.  Cardiovascular: Normal rate, regular rhythm and normal heart sounds.  Exam reveals no gallop and no friction rub.   No murmur heard. Pulmonary/Chest: Effort normal and breath sounds normal. No respiratory distress. She has no wheezes. She has no rales.  Abdominal: Soft. Bowel sounds are normal. She exhibits no distension and no mass. There is no tenderness.  Musculoskeletal: She exhibits edema (trace bilateral pedal edema.).  Lymphadenopathy:    She has no cervical adenopathy.  Neurological: She is alert and oriented to  person, place, and time.  Vitals reviewed.      Recent Results (from the past 2160 hour(s))  Pap IG and HPV (high risk) DNA detection     Status: None   Collection Time: 10/22/15 12:00 AM  Result Value Ref Range   DIAGNOSIS: Comment     Comment: NEGATIVE FOR INTRAEPITHELIAL LESION AND MALIGNANCY.   Specimen adequacy: Comment     Comment: Satisfactory for evaluation. Endocervical and/or squamous metaplastic cells (endocervical component) are present.    CLINICIAN PROVIDED ICD10: Comment     Comment: Z00.00   Performed by: Comment     Comment: Calton Dachamara Adibi, Supervisory Cytotechnologist (ASCP)   PAP SMEAR COMMENT .    Note: Comment     Comment: The Pap smear is a screening test designed to aid in the detection of premalignant and malignant conditions of the uterine cervix.  It is not a diagnostic procedure and should not be used as the sole means of detecting cervical cancer.  Both false-positive and false-negative reports do occur.    Test Methodology Comment     Comment: This liquid based SurePath(R) pap test was screened with the assistance of an image guided system.    HPV, high-risk Negative Negative    Comment: This high-risk HPV test detects thirteen high-risk types (16/18/31/33/35/39/45/51/52/56/58/59/68) without differentiation.   Comprehensive Metabolic Panel (CMET)     Status: Abnormal   Collection Time: 11/06/15  7:43 AM  Result Value Ref Range   Glucose 102 (H) 65 - 99 mg/dL   BUN 13 6 - 24 mg/dL   Creatinine, Ser 1.610.80 0.57 - 1.00 mg/dL   GFR calc non Af Amer 84 >59 mL/min/1.73   GFR calc Af Amer 97 >59 mL/min/1.73   BUN/Creatinine Ratio 16 9 - 23   Sodium 141 134 - 144 mmol/L   Potassium 4.3 3.5 - 5.2 mmol/L   Chloride 102 96 - 106 mmol/L   CO2 25 18 - 29 mmol/L   Calcium 9.0 8.7 - 10.2 mg/dL   Total Protein 6.4 6.0 - 8.5 g/dL   Albumin 3.9 3.5 - 5.5 g/dL   Globulin, Total 2.5 1.5 - 4.5 g/dL   Albumin/Globulin Ratio 1.6 1.2 - 2.2    Comment:                **Please note reference interval change**   Bilirubin Total 0.4 0.0 - 1.2 mg/dL   Alkaline Phosphatase 78 39 - 117 IU/L   AST 18 0 - 40 IU/L   ALT 22 0 - 32 IU/L  Lipid Profile     Status: Abnormal   Collection Time: 11/06/15  7:43 AM  Result Value Ref Range   Cholesterol, Total 186 100 - 199 mg/dL   Triglycerides 096131 0 - 149 mg/dL   HDL 48 >  39 mg/dL   VLDL Cholesterol Cal 26 5 - 40 mg/dL   LDL Calculated 161 (H) 0 - 99 mg/dL   Chol/HDL Ratio 3.9 0.0 - 4.4 ratio units    Comment:                                   T. Chol/HDL Ratio                                             Men  Women                               1/2 Avg.Risk  3.4    3.3                                   Avg.Risk  5.0    4.4                                2X Avg.Risk  9.6    7.1                                3X Avg.Risk 23.4   11.0   CBC with Differential     Status: None   Collection Time: 11/06/15  7:43 AM  Result Value Ref Range   WBC 5.7 3.4 - 10.8 x10E3/uL   RBC 4.81 3.77 - 5.28 x10E6/uL   Hemoglobin 14.7 11.1 - 15.9 g/dL   Hematocrit 09.6 04.5 - 46.6 %   MCV 90 79 - 97 fL   MCH 30.6 26.6 - 33.0 pg   MCHC 33.9 31.5 - 35.7 g/dL   RDW 40.9 81.1 - 91.4 %   Platelets 212 150 - 379 x10E3/uL   Neutrophils 51 %   Lymphs 32 %   Monocytes 11 %   Eos 5 %   Basos 1 %   Neutrophils Absolute 2.9 1.4 - 7.0 x10E3/uL   Lymphocytes Absolute 1.8 0.7 - 3.1 x10E3/uL   Monocytes Absolute 0.6 0.1 - 0.9 x10E3/uL   EOS (ABSOLUTE) 0.3 0.0 - 0.4 x10E3/uL   Basophils Absolute 0.0 0.0 - 0.2 x10E3/uL   Immature Granulocytes 0 %   Immature Grans (Abs) 0.0 0.0 - 0.1 x10E3/uL  TSH     Status: Abnormal   Collection Time: 11/06/15  7:43 AM  Result Value Ref Range   TSH 8.360 (H) 0.450 - 4.500 uIU/mL  Hgb A1c w/o eAG     Status: None   Collection Time: 11/06/15  7:43 AM  Result Value Ref Range   Hgb A1c MFr Bld 5.6 4.8 - 5.6 %    Comment:          Pre-diabetes: 5.7 - 6.4          Diabetes: >6.4          Glycemic control  for adults with diabetes: <7.0   Specimen status report     Status: None   Collection Time: 11/06/15  7:43 AM  Result Value Ref Range   specimen status report Comment  Comment: Written Authorization Written Authorization No Written Authorization Received.      Assessment & Plan  Problem List Items Addressed This Visit      Digestive   GERD (gastroesophageal reflux disease)     Endocrine   Hypothyroidism - Primary   Relevant Orders   TSH      No orders of the defined types were placed in this encounter.   1. Hypothyroidism due to acquired atrophy of thyroid  - TSH Cont. Levothyroid at 100 mcg/d  2. Gastroesophageal reflux disease without esophagitis Cont. Omeprazole

## 2015-12-22 LAB — TSH: TSH: 3.9 u[IU]/mL (ref 0.450–4.500)

## 2016-05-05 ENCOUNTER — Ambulatory Visit: Payer: BLUE CROSS/BLUE SHIELD | Admitting: Family Medicine

## 2016-05-12 ENCOUNTER — Ambulatory Visit (INDEPENDENT_AMBULATORY_CARE_PROVIDER_SITE_OTHER): Payer: BLUE CROSS/BLUE SHIELD | Admitting: Family Medicine

## 2016-05-12 ENCOUNTER — Encounter: Payer: Self-pay | Admitting: Family Medicine

## 2016-05-12 VITALS — BP 124/82 | HR 84 | Temp 98.3°F | Resp 16 | Ht 67.0 in | Wt 221.0 lb

## 2016-05-12 DIAGNOSIS — E034 Atrophy of thyroid (acquired): Secondary | ICD-10-CM

## 2016-05-12 DIAGNOSIS — Z23 Encounter for immunization: Secondary | ICD-10-CM

## 2016-05-12 DIAGNOSIS — E038 Other specified hypothyroidism: Secondary | ICD-10-CM

## 2016-05-12 LAB — TSH: TSH: 2.24 mIU/L

## 2016-05-12 NOTE — Progress Notes (Signed)
Name: Cindy RockerBarbara Mccoy   MRN: [id number]    DOB: 07/05/1961   Date:05/12/2016       Progress Note  Subjective  Chief Complaint  Chief Complaint  Patient presents with  . Hypothyroidism    HPI Here or f/u of Hypothyroidism.  Shed would also like flu shot today.  She is feeling fairly well overall.  No problem-specific Assessment & Plan notes found for this encounter.   Past Medical History:  Diagnosis Date  . Anxiety   . Breast mass, left   . Collapsed lung 2005  . Dermatitis    contact  . Family history of malignant neoplasm of breast   . History of chicken pox   . Pure hypercholesterolemia   . Unspecified menopausal and postmenopausal disorder     Past Surgical History:  Procedure Laterality Date  . BREAST BIOPSY  1981    Family History  Problem Relation Age of Onset  . Cancer Mother     breast  . COPD Mother   . Heart disease Father     CHF    Social History   Social History  . Marital status: Married    Spouse name: N/A  . Number of children: N/A  . Years of education: N/A   Occupational History  . CNA    Social History Main Topics  . Smoking status: Former Games developermoker  . Smokeless tobacco: Never Used     Comment: 29 pack per year history, quit several years ago  . Alcohol use No     Comment: 1-2 drinks per month  . Drug use: No     Comment: Remote marijuana in highschool- no injections  . Sexual activity: Not on file   Other Topics Concern  . Not on file   Social History Narrative  . No narrative on file     Current Outpatient Prescriptions:  .  fluticasone (FLONASE) 50 MCG/ACT nasal spray, Place 2 sprays into the nose daily., Disp: 16 g, Rfl: 5 .  levothyroxine (SYNTHROID, LEVOTHROID) 100 MCG tablet, Take 1 tablet (100 mcg total) by mouth daily., Disp: 30 tablet, Rfl: 6 .  Multiple Vitamin (MULTIVITAMIN) tablet, Take 1 tablet by mouth daily., Disp: , Rfl:  .  omeprazole (PRILOSEC) 20 MG capsule, Take 1 capsule (20 mg total) by mouth daily.,  Disp: 90 capsule, Rfl: 3  No Known Allergies   Review of Systems  Constitutional: Negative for chills, fever, malaise/fatigue and weight loss.  HENT: Negative for hearing loss.   Eyes: Negative for blurred vision and double vision.  Respiratory: Negative for cough, shortness of breath and wheezing.   Cardiovascular: Negative for chest pain, palpitations and leg swelling.  Gastrointestinal: Negative for abdominal pain, blood in stool and heartburn.  Genitourinary: Negative for dysuria and urgency.  Musculoskeletal: Negative for joint pain and myalgias.  Skin: Negative for rash.  Neurological: Negative for dizziness, tremors, weakness and headaches.      Objective  Vitals:   05/12/16 1356  BP: 124/82  Pulse: 84  Resp: 16  Temp: 98.3 F (36.8 C)  TempSrc: Oral  Weight: 221 lb (100.2 kg)  Height: 5\' 7"  (1.702 m)    Physical Exam  Constitutional: She is oriented to person, place, and time and well-developed, well-nourished, and in no distress. No distress.  HENT:  Head: Normocephalic and atraumatic.  Eyes: Conjunctivae and EOM are normal. Pupils are equal, round, and reactive to light. No scleral icterus.  Neck: Normal range of motion. Neck supple. Carotid bruit is  not present. No thyromegaly present.  Cardiovascular: Normal rate, regular rhythm and normal heart sounds.  Exam reveals no gallop and no friction rub.   No murmur heard. Pulmonary/Chest: Effort normal and breath sounds normal. No respiratory distress. She has no wheezes. She has no rales.  Abdominal: Soft. Bowel sounds are normal. She exhibits no distension and no mass. There is no tenderness.  Musculoskeletal: She exhibits no edema.  Lymphadenopathy:    She has no cervical adenopathy.  Neurological: She is alert and oriented to person, place, and time.  Vitals reviewed.      No results found for this or any previous visit (from the past 2160 hour(s)).   Assessment & Plan  Problem List Items Addressed  This Visit      Endocrine   Hypothyroidism - Primary   Relevant Orders   TSH    Other Visit Diagnoses    Immunization due       Relevant Orders   Flu Vaccine QUAD 36+ mos PF IM (Fluarix & Fluzone Quad PF)      No orders of the defined types were placed in this encounter. 1. Hypothyroidism due to acquired atrophy of thyroid Cont Levothnyroxine - TSH  2. Immunization due  - Flu Vaccine QUAD 36+ mos PF IM (Fluarix & Fluzone Quad PF)

## 2016-05-17 ENCOUNTER — Other Ambulatory Visit: Payer: Self-pay | Admitting: Family Medicine

## 2016-05-17 DIAGNOSIS — E039 Hypothyroidism, unspecified: Secondary | ICD-10-CM

## 2016-05-17 MED ORDER — LEVOTHYROXINE SODIUM 100 MCG PO TABS
100.0000 ug | ORAL_TABLET | Freq: Every day | ORAL | 6 refills | Status: DC
Start: 2016-05-17 — End: 2016-09-15

## 2016-09-05 ENCOUNTER — Encounter: Payer: Self-pay | Admitting: Family Medicine

## 2016-09-05 ENCOUNTER — Ambulatory Visit (INDEPENDENT_AMBULATORY_CARE_PROVIDER_SITE_OTHER): Payer: BLUE CROSS/BLUE SHIELD | Admitting: Family Medicine

## 2016-09-05 VITALS — BP 133/79 | HR 78 | Temp 97.6°F | Resp 16 | Ht 67.0 in | Wt 227.0 lb

## 2016-09-05 DIAGNOSIS — E034 Atrophy of thyroid (acquired): Secondary | ICD-10-CM | POA: Diagnosis not present

## 2016-09-05 DIAGNOSIS — K219 Gastro-esophageal reflux disease without esophagitis: Secondary | ICD-10-CM

## 2016-09-05 DIAGNOSIS — Z0000 Encounter for general adult medical examination without abnormal findings: Secondary | ICD-10-CM | POA: Diagnosis not present

## 2016-09-05 DIAGNOSIS — Z1231 Encounter for screening mammogram for malignant neoplasm of breast: Secondary | ICD-10-CM

## 2016-09-05 NOTE — Progress Notes (Signed)
Name: Cindy Mccoy   MRN: [id number]    DOB: [birth year]   Date:09/05/2016       Progress Note  Subjective  Chief Complaint  Chief Complaint  Patient presents with  . Annual Exam    HPI Here for health maintenance complete physical.  She takes her Levothyroxine for thyroid. and Omeprazole for GERD.  She has no real c/o.  No problem-specific Assessment & Plan notes found for this encounter.   Past Medical History:  Diagnosis Date  . Anxiety   . Breast mass, left   . Collapsed lung 2005  . Dermatitis    contact  . Family history of malignant neoplasm of breast   . History of chicken pox   . Pure hypercholesterolemia   . Unspecified menopausal and postmenopausal disorder     Social History  Substance Use Topics  . Smoking status: Former Games developer  . Smokeless tobacco: Never Used     Comment: 29 pack per year history, quit several years ago  . Alcohol use No     Comment: 1-2 drinks per month     Current Outpatient Prescriptions:  .  levothyroxine (SYNTHROID, LEVOTHROID) 100 MCG tablet, Take 1 tablet (100 mcg total) by mouth daily., Disp: 30 tablet, Rfl: 6 .  Multiple Vitamin (MULTIVITAMIN) tablet, Take 1 tablet by mouth daily., Disp: , Rfl:  .  omeprazole (PRILOSEC) 20 MG capsule, Take 1 capsule (20 mg total) by mouth daily., Disp: 90 capsule, Rfl: 3  Not on File  Review of Systems  Constitutional: Negative for chills, fever, malaise/fatigue and weight loss.  HENT: Negative for hearing loss and tinnitus.   Eyes: Negative for blurred vision and double vision.  Respiratory: Negative for cough, hemoptysis, shortness of breath and wheezing.   Cardiovascular: Negative for chest pain, palpitations and leg swelling.  Gastrointestinal: Negative for abdominal pain, blood in stool, heartburn and nausea.  Genitourinary: Negative for dysuria, frequency and urgency.  Musculoskeletal: Negative for joint pain and myalgias.  Skin: Negative for rash.  Neurological: Negative for  dizziness, tingling, tremors, weakness and headaches.      Objective  Vitals:   09/05/16 1456  BP: 133/79  Pulse: 78  Resp: 16  Temp: 97.6 F (36.4 C)  TempSrc: Oral  Weight: 227 lb (103 kg)  Height: 5\' 7"  (1.702 m)     Physical Exam  Constitutional: She is oriented to person, place, and time and well-developed, well-nourished, and in no distress. No distress.  HENT:  Head: Normocephalic and atraumatic.  Right Ear: External ear normal.  Left Ear: External ear normal.  Nose: Nose normal.  Mouth/Throat: Oropharynx is clear and moist.  Eyes: Conjunctivae and EOM are normal. Pupils are equal, round, and reactive to light. No scleral icterus.  Fundoscopic exam:      The right eye shows no arteriolar narrowing, no AV nicking, no exudate, no hemorrhage and no papilledema.       The left eye shows no arteriolar narrowing, no AV nicking, no exudate, no hemorrhage and no papilledema.  Neck: Normal range of motion. Neck supple. Carotid bruit is not present. No thyromegaly present.  Cardiovascular: Normal rate, regular rhythm and normal heart sounds.  Exam reveals no gallop and no friction rub.   No murmur heard. Pulmonary/Chest: Effort normal and breath sounds normal. No respiratory distress. She has no wheezes. She has no rales. Right breast exhibits no inverted nipple, no mass, no nipple discharge, no skin change and no tenderness. Left breast exhibits no inverted nipple, no  mass, no nipple discharge, no skin change and no tenderness. Breasts are symmetrical.  Abdominal: Soft. Bowel sounds are normal. She exhibits no distension and no mass. There is no tenderness.  Genitourinary: Vagina normal, uterus normal, cervix normal, right adnexa normal and left adnexa normal. No vaginal discharge found.  Musculoskeletal: Normal range of motion. She exhibits no edema, tenderness or deformity.  Lymphadenopathy:    She has no cervical adenopathy.  Neurological: She is alert and oriented to  person, place, and time. Gait normal.  Vitals reviewed.     No results found for this or any previous visit (from the past 2160 hour(s)).   Assessment & Plan  There are no diagnoses linked to this encounter.

## 2016-09-13 ENCOUNTER — Other Ambulatory Visit: Payer: BLUE CROSS/BLUE SHIELD

## 2016-09-13 DIAGNOSIS — E034 Atrophy of thyroid (acquired): Secondary | ICD-10-CM | POA: Diagnosis not present

## 2016-09-13 DIAGNOSIS — Z0000 Encounter for general adult medical examination without abnormal findings: Secondary | ICD-10-CM | POA: Diagnosis not present

## 2016-09-13 DIAGNOSIS — K219 Gastro-esophageal reflux disease without esophagitis: Secondary | ICD-10-CM | POA: Diagnosis not present

## 2016-09-14 LAB — CBC WITH DIFFERENTIAL/PLATELET
Basophils Absolute: 55 cells/uL (ref 0–200)
Basophils Relative: 1 %
Eosinophils Absolute: 220 cells/uL (ref 15–500)
Eosinophils Relative: 4 %
HCT: 43.8 % (ref 35.0–45.0)
Hemoglobin: 14.4 g/dL (ref 11.7–15.5)
Lymphocytes Relative: 29 %
Lymphs Abs: 1595 cells/uL (ref 850–3900)
MCH: 29.6 pg (ref 27.0–33.0)
MCHC: 32.9 g/dL (ref 32.0–36.0)
MCV: 90.1 fL (ref 80.0–100.0)
MPV: 10.7 fL (ref 7.5–12.5)
Monocytes Absolute: 495 cells/uL (ref 200–950)
Monocytes Relative: 9 %
Neutro Abs: 3135 cells/uL (ref 1500–7800)
Neutrophils Relative %: 57 %
Platelets: 210 10*3/uL (ref 140–400)
RBC: 4.86 MIL/uL (ref 3.80–5.10)
RDW: 13.9 % (ref 11.0–15.0)
WBC: 5.5 10*3/uL (ref 3.8–10.8)

## 2016-09-14 LAB — LIPID PANEL
Cholesterol: 163 mg/dL (ref <200)
HDL: 51 mg/dL (ref >50)
LDL Cholesterol: 87 mg/dL (ref <100)
Total CHOL/HDL Ratio: 3.2 Ratio (ref <5.0)
Triglycerides: 124 mg/dL (ref <150)
VLDL: 25 mg/dL (ref <30)

## 2016-09-14 LAB — COMPLETE METABOLIC PANEL WITH GFR
ALT: 13 U/L (ref 6–29)
AST: 14 U/L (ref 10–35)
Albumin: 3.9 g/dL (ref 3.6–5.1)
Alkaline Phosphatase: 69 U/L (ref 33–130)
BUN: 16 mg/dL (ref 7–25)
CO2: 29 mmol/L (ref 20–31)
Calcium: 8.8 mg/dL (ref 8.6–10.4)
Chloride: 103 mmol/L (ref 98–110)
Creat: 0.85 mg/dL (ref 0.50–1.05)
GFR, Est African American: 89 mL/min (ref >=60)
GFR, Est Non African American: 77 mL/min (ref >=60)
Glucose, Bld: 97 mg/dL (ref 65–99)
Potassium: 4.6 mmol/L (ref 3.5–5.3)
Sodium: 140 mmol/L (ref 135–146)
Total Bilirubin: 0.6 mg/dL (ref 0.2–1.2)
Total Protein: 6.4 g/dL (ref 6.1–8.1)

## 2016-09-14 LAB — TSH: TSH: 6.03 mIU/L — ABNORMAL HIGH

## 2016-09-15 ENCOUNTER — Other Ambulatory Visit: Payer: Self-pay | Admitting: Family Medicine

## 2016-09-15 DIAGNOSIS — E034 Atrophy of thyroid (acquired): Secondary | ICD-10-CM

## 2016-09-15 MED ORDER — LEVOTHYROXINE SODIUM 125 MCG PO TABS: 125.0000 ug | ORAL_TABLET | Freq: Every day | ORAL | 1 refills | Status: DC

## 2016-09-29 ENCOUNTER — Other Ambulatory Visit: Payer: Self-pay | Admitting: Family Medicine

## 2016-09-29 DIAGNOSIS — Z1231 Encounter for screening mammogram for malignant neoplasm of breast: Secondary | ICD-10-CM

## 2016-10-13 ENCOUNTER — Ambulatory Visit
Admission: RE | Admit: 2016-10-13 | Discharge: 2016-10-13 | Disposition: A | Discharge status: Home / Self Care | Payer: BLUE CROSS/BLUE SHIELD | Source: Ambulatory Visit | Attending: Family Medicine | Admitting: Family Medicine

## 2016-10-13 DIAGNOSIS — Z1231 Encounter for screening mammogram for malignant neoplasm of breast: Secondary | ICD-10-CM

## 2016-11-10 ENCOUNTER — Other Ambulatory Visit: Payer: Self-pay | Admitting: Family Medicine

## 2016-11-10 DIAGNOSIS — K219 Gastro-esophageal reflux disease without esophagitis: Secondary | ICD-10-CM

## 2017-02-27 ENCOUNTER — Ambulatory Visit (INDEPENDENT_AMBULATORY_CARE_PROVIDER_SITE_OTHER): Payer: BLUE CROSS/BLUE SHIELD | Admitting: Family Medicine

## 2017-02-27 ENCOUNTER — Encounter: Payer: Self-pay | Admitting: Family Medicine

## 2017-02-27 VITALS — BP 132/80 | HR 65 | Temp 97.5°F | Resp 16 | Ht 67.0 in | Wt 228.0 lb

## 2017-02-27 DIAGNOSIS — R7309 Other abnormal glucose: Secondary | ICD-10-CM | POA: Diagnosis not present

## 2017-02-27 DIAGNOSIS — E034 Atrophy of thyroid (acquired): Secondary | ICD-10-CM

## 2017-02-27 DIAGNOSIS — E669 Obesity, unspecified: Secondary | ICD-10-CM

## 2017-02-27 NOTE — Progress Notes (Addendum)
Subjective:    Patient ID: Cindy Mccoy, female    DOB: [birth year], 56 y.o.   MRN: [id number]  Cindy Mccoy is a 56 y.o. female presenting on 02/27/2017 for Thyroid Problem (follow up)   HPI   Hypothyroidism: - Reports symptoms of feeling more tired, depressed mood, and sluggish and feels similar to previous when her thyroid hormone was off, also admits some weigh gain and difficulty losing weight. Previously managed by her prior PCP Dr Luan Pulling, last visit about 6 months ago. TSH reviewed labs since 2011, with initial elevated readings from 6 to 8.9, then improved in 2014 with TSH in the 3s. Recently 2017, TSH 8.360, then down to 3.9 to 2.24, then up again in 08/2016 to 6.03. - Continues to take levothyroxine, dose has changed some, but she remains at highest dose she has been on levothyroxine 170mg daily, has not run out - Admits insomnia - Denies temperature changes, hair or nail changes, appetite changes, abdominal pain, constipation, diarrhea, dyspnea  OBESITY BMI >35 / Abnormal Glucose - Reports chronic problem with difficulty managing weight, has had gradual gain, unable to lose weight. Tried variety of nutritional or dietary plans, no specific regimen currently. Some exercise, limited more recently with energy. Admits history of back pain and sciatica. - Also has had prior elevated glucose and A1c to 5.6 in past, without a diagnosis of Pre-DM but is higher than appropriate range, would like repeat A1c testing   [facility] 09/05/2016 05/12/2016 10/22/2015  Decreased Interest 0 0 0  Down, Depressed, Hopeless 0 0 0  PHQ - 2 Score 0 0 0    Social History  Substance Use Topics  . Smoking status: Former SResearch scientist (life sciences) . Smokeless tobacco: Never Used     Comment: 29 pack per year history, quit several years ago  . Alcohol use No     Comment: 1-2 drinks per month    Review of Systems Per HPI unless specifically indicated above     Objective:    BP 132/80 (BP  Location: Left Arm, Cuff Size: Large)   Pulse 65   Temp (!) 97.5 F (36.4 C) (Oral)   Resp 16   Ht _0  (1.702 m)   Wt 228 lb (103.4 kg)   LMP 09/23/2012   BMI 35.71 kg/m   Wt Readings from Last 3 Encounters:  02/27/17 228 lb (103.4 kg)  09/05/16 227 lb (103 kg)  05/12/16 221 lb (100.2 kg)    Physical Exam  Constitutional: She is oriented to person, place, and time. She appears well-developed and well-nourished. No distress.  Well-appearing, comfortable, cooperative, obese  HENT:  Head: Normocephalic and atraumatic.  Mouth/Throat: Oropharynx is clear and moist.  Eyes: Conjunctivae are normal. Right eye exhibits no discharge. Left eye exhibits no discharge.  Neck: Normal range of motion. Neck supple. No thyromegaly present.  Cardiovascular: Normal rate, regular rhythm, normal heart sounds and intact distal pulses.   No murmur heard. Pulmonary/Chest: Effort normal and breath sounds normal. No respiratory distress. She has no wheezes. She has no rales.  Musculoskeletal: She exhibits no edema.  Lymphadenopathy:    She has no cervical adenopathy.  Neurological: She is alert and oriented to person, place, and time.  Skin: Skin is warm and dry. No rash noted. She is not diaphoretic. No erythema.  Psychiatric: She has a normal mood and affect. Her behavior is normal.  Well groomed, good eye contact, normal speech and thoughts  Nursing note and vitals reviewed.  Results for orders placed or performed in visit on 09/05/16  COMPLETE METABOLIC PANEL WITH GFR  Result Value Ref Range   Sodium 140 135 - 146 mmol/L   Potassium 4.6 3.5 - 5.3 mmol/L   Chloride 103 98 - 110 mmol/L   CO2 29 20 - 31 mmol/L   Glucose, Bld 97 65 - 99 mg/dL   BUN 16 7 - 25 mg/dL   Creat 0.85 0.50 - 1.05 mg/dL   Total Bilirubin 0.6 0.2 - 1.2 mg/dL   Alkaline Phosphatase 69 33 - 130 U/L   AST 14 10 - 35 U/L   ALT 13 6 - 29 U/L   Total Protein 6.4 6.1 - 8.1 g/dL   Albumin 3.9 3.6 - 5.1 g/dL   Calcium 8.8 8.6  - 10.4 mg/dL   GFR, Est African American 89 >=60 mL/min   GFR, Est Non African American 77 >=60 mL/min  CBC with Differential  Result Value Ref Range   WBC 5.5 3.8 - 10.8 K/uL   RBC 4.86 3.80 - 5.10 MIL/uL   Hemoglobin 14.4 11.7 - 15.5 g/dL   HCT 43.8 35.0 - 45.0 %   MCV 90.1 80.0 - 100.0 fL   MCH 29.6 27.0 - 33.0 pg   MCHC 32.9 32.0 - 36.0 g/dL   RDW 13.9 11.0 - 15.0 %   Platelets 210 140 - 400 K/uL   MPV 10.7 7.5 - 12.5 fL   Neutro Abs 3,135 1,500 - 7,800 cells/uL   Lymphs Abs 1,595 850 - 3,900 cells/uL   Monocytes Absolute 495 200 - 950 cells/uL   Eosinophils Absolute 220 15 - 500 cells/uL   Basophils Absolute 55 0 - 200 cells/uL   Neutrophils Relative % 57 %   Lymphocytes Relative 29 %   Monocytes Relative 9 %   Eosinophils Relative 4 %   Basophils Relative 1 %   Smear Review Criteria for review not met   Lipid Profile  Result Value Ref Range   Cholesterol 163 <200 mg/dL   Triglycerides 124 <150 mg/dL   HDL 51 >50 mg/dL   Total CHOL/HDL Ratio 3.2 <5.0 Ratio   VLDL 25 <30 mg/dL   LDL Cholesterol 87 <100 mg/dL  TSH  Result Value Ref Range   TSH 6.03 (H) mIU/L      Assessment & Plan:    Problem List Items Addressed This Visit    Obesity (BMI 35.0-39.9 without comorbidity)    Gradual weight gain trend, limited weight loss in past. Suspect may be contributing to some of her symptoms decreased energy, maybe related to inadequately controlled hypothyroidism Treat underlying thyroid, consider empiric therapy for depression Offered referral to Dr Dennard Nip [facility] for medical evaluation for obesity given limited weight loss in past, she is interested will consider      Hypothyroidism - Primary    Suspect likely contributing factor, may not be entire cause of constellation of symptoms. May have underlying depression, despite not admitting to this and negative PHQ2 screening. May be some poor sleep as well, or related to weight.  Plan: 1. Check TSH,  Free T4, and T3 - last time 6 months ago had elevated TSH 2. Continue Levothyroxine 147mg daily for now - may need to increase to either 137 vs 150 mcg based on TSH result, will follow-up 3. Follow-up in 3 months - discussed as well [facility] discussion on thyroid management if sub-optimal control still, patient agrees for second opinion,  lastly may end up treating with SSRI in future if thyroid is not source      Relevant Orders   TSH   T4, free   T3    Other Visit Diagnoses    Abnormal glucose      Prior elevated glucose, A1c 5.6 in past, will re-check A1c given symptoms to rule out Pre-DM vs DM contributing    Relevant Orders   Hemoglobin A1c        No orders of the defined types were placed in this encounter.     Follow up plan: Return in about 3 months (around 05/30/2017) for Thyroid, BP.  Nobie Putnam, [facility] 02/27/2017, 1:05 PM

## 2017-02-27 NOTE — Patient Instructions (Addendum)
Thank you for coming to the clinic today.  1.  Will check Thyroid labs today, and contact you with results  If thyroid TSH is elevated, then we will increase dose from 125mcg to either 137mcg or 150mcg daily - will send new rx if needed.  If thyroid results are NORMAL and you still feel not normal, then would recommend a second opinion from Endocrinology.  [facility]INOLOGY  [facility] [address] [place], [place]  [address] Phone: [phone]  [facility] [place] [address] [place], [place]  [address] Phone: [phone]  A. Wendall MolaMelissa Solum, MD ([place]) Ellie LunchBhakti Paul, MD ([place]) Scherrie NovemberAbby T. Abisogun, MD, [facility]([place] & [place]) ----------------------------------------------------------------------------------  WEIGHT MANAGEMENT  Dr Quillian Quincearen Beasley  [facility] [address] [place], [place] [address] Ph: [phone] -----------------------------------------------------------------------------------  Please schedule a Follow-up Appointment to: Return in about 3 months (around 05/30/2017) for Thyroid, BP.  If you have any other questions or concerns, please feel free to call the clinic or send a message through MyChart. You may also schedule an earlier appointment if necessary.  Additionally, you may be receiving a survey about your experience at our clinic within a few days to 1 week by e-mail or mail. We value your feedback.  Saralyn PilarAlexander Kayin Osment, DO [facility]

## 2017-02-27 NOTE — Assessment & Plan Note (Signed)
Gradual weight gain trend, limited weight loss in past. Suspect may be contributing to some of her symptoms decreased energy, maybe related to inadequately controlled hypothyroidism Treat underlying thyroid, consider empiric therapy for depression Offered referral to Dr Marian Sorrowaren Beasley [facility] for medical evaluation for obesity given limited weight loss in past, she is interested will consider

## 2017-02-27 NOTE — Assessment & Plan Note (Signed)
Suspect likely contributing factor, may not be entire cause of constellation of symptoms. May have underlying depression, despite not admitting to this and negative PHQ2 screening. May be some poor sleep as well, or related to weight.  Plan: 1. Check TSH, Free T4, and T3 - last time 6 months ago had elevated TSH 2. Continue Levothyroxine 125mcg daily for now - may need to increase to either 137 vs 150 mcg based on TSH result, will follow-up 3. Follow-up in 3 months - discussed as well [facility] for further discussion on thyroid management if sub-optimal control still, patient agrees for second opinion, lastly may end up treating with SSRI in future if thyroid is not source

## 2017-02-28 LAB — TSH: TSH: 1.12 mIU/L

## 2017-02-28 LAB — T4, FREE: Free T4: 1.6 ng/dL (ref 0.8–1.8)

## 2017-02-28 LAB — T3: T3, Total: 115 ng/dL (ref 76–181)

## 2017-02-28 LAB — HEMOGLOBIN A1C
Hgb A1c MFr Bld: 5.5 % (ref <5.7)
Mean Plasma Glucose: 111 mg/dL

## 2017-03-13 DIAGNOSIS — M5441 Lumbago with sciatica, right side: Secondary | ICD-10-CM | POA: Diagnosis not present

## 2017-03-13 DIAGNOSIS — M9905 Segmental and somatic dysfunction of pelvic region: Secondary | ICD-10-CM | POA: Diagnosis not present

## 2017-03-13 DIAGNOSIS — M9904 Segmental and somatic dysfunction of sacral region: Secondary | ICD-10-CM | POA: Diagnosis not present

## 2017-03-13 DIAGNOSIS — M9903 Segmental and somatic dysfunction of lumbar region: Secondary | ICD-10-CM | POA: Diagnosis not present

## 2017-03-14 DIAGNOSIS — M5441 Lumbago with sciatica, right side: Secondary | ICD-10-CM | POA: Diagnosis not present

## 2017-03-14 DIAGNOSIS — M9905 Segmental and somatic dysfunction of pelvic region: Secondary | ICD-10-CM | POA: Diagnosis not present

## 2017-03-14 DIAGNOSIS — M9904 Segmental and somatic dysfunction of sacral region: Secondary | ICD-10-CM | POA: Diagnosis not present

## 2017-03-14 DIAGNOSIS — M9903 Segmental and somatic dysfunction of lumbar region: Secondary | ICD-10-CM | POA: Diagnosis not present

## 2017-03-15 ENCOUNTER — Telehealth: Payer: Self-pay | Admitting: Family Medicine

## 2017-03-15 DIAGNOSIS — E034 Atrophy of thyroid (acquired): Secondary | ICD-10-CM

## 2017-03-15 MED ORDER — LEVOTHYROXINE SODIUM 125 MCG PO TABS: 125.0000 ug | ORAL_TABLET | Freq: Every day | ORAL | 1 refills | Status: DC

## 2017-03-15 NOTE — Telephone Encounter (Signed)
Pt needs a refill on [facility]othyroxine sent to [facility].  Her call back number is [phone]

## 2017-03-15 NOTE — Telephone Encounter (Signed)
Refilled Levothyroxine 125mcg daily #90 +[facility]  Alexander Karamalegos, DO [facility] 03/15/2017, 12:03 PM

## 2017-03-16 ENCOUNTER — Other Ambulatory Visit: Payer: Self-pay

## 2017-03-16 DIAGNOSIS — M9905 Segmental and somatic dysfunction of pelvic region: Secondary | ICD-10-CM | POA: Diagnosis not present

## 2017-03-16 DIAGNOSIS — M9904 Segmental and somatic dysfunction of sacral region: Secondary | ICD-10-CM | POA: Diagnosis not present

## 2017-03-16 DIAGNOSIS — M5441 Lumbago with sciatica, right side: Secondary | ICD-10-CM | POA: Diagnosis not present

## 2017-03-16 DIAGNOSIS — M9903 Segmental and somatic dysfunction of lumbar region: Secondary | ICD-10-CM | POA: Diagnosis not present

## 2017-03-21 DIAGNOSIS — M5441 Lumbago with sciatica, right side: Secondary | ICD-10-CM | POA: Diagnosis not present

## 2017-03-21 DIAGNOSIS — M9903 Segmental and somatic dysfunction of lumbar region: Secondary | ICD-10-CM | POA: Diagnosis not present

## 2017-03-21 DIAGNOSIS — M9904 Segmental and somatic dysfunction of sacral region: Secondary | ICD-10-CM | POA: Diagnosis not present

## 2017-03-21 DIAGNOSIS — M9905 Segmental and somatic dysfunction of pelvic region: Secondary | ICD-10-CM | POA: Diagnosis not present

## 2017-03-27 DIAGNOSIS — M5441 Lumbago with sciatica, right side: Secondary | ICD-10-CM | POA: Diagnosis not present

## 2017-03-27 DIAGNOSIS — M9905 Segmental and somatic dysfunction of pelvic region: Secondary | ICD-10-CM | POA: Diagnosis not present

## 2017-03-27 DIAGNOSIS — M9903 Segmental and somatic dysfunction of lumbar region: Secondary | ICD-10-CM | POA: Diagnosis not present

## 2017-03-27 DIAGNOSIS — M9904 Segmental and somatic dysfunction of sacral region: Secondary | ICD-10-CM | POA: Diagnosis not present

## 2017-03-28 DIAGNOSIS — M9905 Segmental and somatic dysfunction of pelvic region: Secondary | ICD-10-CM | POA: Diagnosis not present

## 2017-03-28 DIAGNOSIS — M9904 Segmental and somatic dysfunction of sacral region: Secondary | ICD-10-CM | POA: Diagnosis not present

## 2017-03-28 DIAGNOSIS — M9903 Segmental and somatic dysfunction of lumbar region: Secondary | ICD-10-CM | POA: Diagnosis not present

## 2017-03-28 DIAGNOSIS — M5441 Lumbago with sciatica, right side: Secondary | ICD-10-CM | POA: Diagnosis not present

## 2017-03-30 DIAGNOSIS — M5441 Lumbago with sciatica, right side: Secondary | ICD-10-CM | POA: Diagnosis not present

## 2017-03-30 DIAGNOSIS — M9903 Segmental and somatic dysfunction of lumbar region: Secondary | ICD-10-CM | POA: Diagnosis not present

## 2017-03-30 DIAGNOSIS — M9905 Segmental and somatic dysfunction of pelvic region: Secondary | ICD-10-CM | POA: Diagnosis not present

## 2017-03-30 DIAGNOSIS — M9904 Segmental and somatic dysfunction of sacral region: Secondary | ICD-10-CM | POA: Diagnosis not present

## 2017-04-04 DIAGNOSIS — M5441 Lumbago with sciatica, right side: Secondary | ICD-10-CM | POA: Diagnosis not present

## 2017-04-04 DIAGNOSIS — M9905 Segmental and somatic dysfunction of pelvic region: Secondary | ICD-10-CM | POA: Diagnosis not present

## 2017-04-04 DIAGNOSIS — M9903 Segmental and somatic dysfunction of lumbar region: Secondary | ICD-10-CM | POA: Diagnosis not present

## 2017-04-04 DIAGNOSIS — M9904 Segmental and somatic dysfunction of sacral region: Secondary | ICD-10-CM | POA: Diagnosis not present

## 2017-04-06 DIAGNOSIS — M9903 Segmental and somatic dysfunction of lumbar region: Secondary | ICD-10-CM | POA: Diagnosis not present

## 2017-04-06 DIAGNOSIS — M5441 Lumbago with sciatica, right side: Secondary | ICD-10-CM | POA: Diagnosis not present

## 2017-04-06 DIAGNOSIS — M9905 Segmental and somatic dysfunction of pelvic region: Secondary | ICD-10-CM | POA: Diagnosis not present

## 2017-04-06 DIAGNOSIS — M9904 Segmental and somatic dysfunction of sacral region: Secondary | ICD-10-CM | POA: Diagnosis not present

## 2017-04-11 DIAGNOSIS — M9904 Segmental and somatic dysfunction of sacral region: Secondary | ICD-10-CM | POA: Diagnosis not present

## 2017-04-11 DIAGNOSIS — M5441 Lumbago with sciatica, right side: Secondary | ICD-10-CM | POA: Diagnosis not present

## 2017-04-11 DIAGNOSIS — M9905 Segmental and somatic dysfunction of pelvic region: Secondary | ICD-10-CM | POA: Diagnosis not present

## 2017-04-11 DIAGNOSIS — M9903 Segmental and somatic dysfunction of lumbar region: Secondary | ICD-10-CM | POA: Diagnosis not present

## 2017-04-13 DIAGNOSIS — M9903 Segmental and somatic dysfunction of lumbar region: Secondary | ICD-10-CM | POA: Diagnosis not present

## 2017-04-13 DIAGNOSIS — M9904 Segmental and somatic dysfunction of sacral region: Secondary | ICD-10-CM | POA: Diagnosis not present

## 2017-04-13 DIAGNOSIS — M9905 Segmental and somatic dysfunction of pelvic region: Secondary | ICD-10-CM | POA: Diagnosis not present

## 2017-04-13 DIAGNOSIS — M5441 Lumbago with sciatica, right side: Secondary | ICD-10-CM | POA: Diagnosis not present

## 2017-04-18 DIAGNOSIS — M5441 Lumbago with sciatica, right side: Secondary | ICD-10-CM | POA: Diagnosis not present

## 2017-04-18 DIAGNOSIS — M9903 Segmental and somatic dysfunction of lumbar region: Secondary | ICD-10-CM | POA: Diagnosis not present

## 2017-04-18 DIAGNOSIS — M9904 Segmental and somatic dysfunction of sacral region: Secondary | ICD-10-CM | POA: Diagnosis not present

## 2017-04-18 DIAGNOSIS — M9905 Segmental and somatic dysfunction of pelvic region: Secondary | ICD-10-CM | POA: Diagnosis not present

## 2017-04-20 DIAGNOSIS — M9904 Segmental and somatic dysfunction of sacral region: Secondary | ICD-10-CM | POA: Diagnosis not present

## 2017-04-20 DIAGNOSIS — M9905 Segmental and somatic dysfunction of pelvic region: Secondary | ICD-10-CM | POA: Diagnosis not present

## 2017-04-20 DIAGNOSIS — M5441 Lumbago with sciatica, right side: Secondary | ICD-10-CM | POA: Diagnosis not present

## 2017-04-20 DIAGNOSIS — M9903 Segmental and somatic dysfunction of lumbar region: Secondary | ICD-10-CM | POA: Diagnosis not present

## 2017-04-25 DIAGNOSIS — M9903 Segmental and somatic dysfunction of lumbar region: Secondary | ICD-10-CM | POA: Diagnosis not present

## 2017-04-25 DIAGNOSIS — M9904 Segmental and somatic dysfunction of sacral region: Secondary | ICD-10-CM | POA: Diagnosis not present

## 2017-04-25 DIAGNOSIS — M5441 Lumbago with sciatica, right side: Secondary | ICD-10-CM | POA: Diagnosis not present

## 2017-04-25 DIAGNOSIS — M9905 Segmental and somatic dysfunction of pelvic region: Secondary | ICD-10-CM | POA: Diagnosis not present

## 2017-04-27 DIAGNOSIS — M9904 Segmental and somatic dysfunction of sacral region: Secondary | ICD-10-CM | POA: Diagnosis not present

## 2017-04-27 DIAGNOSIS — M9903 Segmental and somatic dysfunction of lumbar region: Secondary | ICD-10-CM | POA: Diagnosis not present

## 2017-04-27 DIAGNOSIS — M9905 Segmental and somatic dysfunction of pelvic region: Secondary | ICD-10-CM | POA: Diagnosis not present

## 2017-04-27 DIAGNOSIS — M5441 Lumbago with sciatica, right side: Secondary | ICD-10-CM | POA: Diagnosis not present

## 2017-05-02 DIAGNOSIS — M9903 Segmental and somatic dysfunction of lumbar region: Secondary | ICD-10-CM | POA: Diagnosis not present

## 2017-05-02 DIAGNOSIS — M9904 Segmental and somatic dysfunction of sacral region: Secondary | ICD-10-CM | POA: Diagnosis not present

## 2017-05-02 DIAGNOSIS — M5441 Lumbago with sciatica, right side: Secondary | ICD-10-CM | POA: Diagnosis not present

## 2017-05-02 DIAGNOSIS — M9905 Segmental and somatic dysfunction of pelvic region: Secondary | ICD-10-CM | POA: Diagnosis not present

## 2017-05-04 DIAGNOSIS — M9905 Segmental and somatic dysfunction of pelvic region: Secondary | ICD-10-CM | POA: Diagnosis not present

## 2017-05-04 DIAGNOSIS — M9903 Segmental and somatic dysfunction of lumbar region: Secondary | ICD-10-CM | POA: Diagnosis not present

## 2017-05-04 DIAGNOSIS — M5441 Lumbago with sciatica, right side: Secondary | ICD-10-CM | POA: Diagnosis not present

## 2017-05-04 DIAGNOSIS — M9904 Segmental and somatic dysfunction of sacral region: Secondary | ICD-10-CM | POA: Diagnosis not present

## 2017-05-09 DIAGNOSIS — M9905 Segmental and somatic dysfunction of pelvic region: Secondary | ICD-10-CM | POA: Diagnosis not present

## 2017-05-09 DIAGNOSIS — M5441 Lumbago with sciatica, right side: Secondary | ICD-10-CM | POA: Diagnosis not present

## 2017-05-09 DIAGNOSIS — M9904 Segmental and somatic dysfunction of sacral region: Secondary | ICD-10-CM | POA: Diagnosis not present

## 2017-05-09 DIAGNOSIS — M9903 Segmental and somatic dysfunction of lumbar region: Secondary | ICD-10-CM | POA: Diagnosis not present

## 2017-05-11 DIAGNOSIS — M9905 Segmental and somatic dysfunction of pelvic region: Secondary | ICD-10-CM | POA: Diagnosis not present

## 2017-05-11 DIAGNOSIS — M9904 Segmental and somatic dysfunction of sacral region: Secondary | ICD-10-CM | POA: Diagnosis not present

## 2017-05-11 DIAGNOSIS — M9903 Segmental and somatic dysfunction of lumbar region: Secondary | ICD-10-CM | POA: Diagnosis not present

## 2017-05-11 DIAGNOSIS — M5441 Lumbago with sciatica, right side: Secondary | ICD-10-CM | POA: Diagnosis not present

## 2017-05-16 DIAGNOSIS — M9905 Segmental and somatic dysfunction of pelvic region: Secondary | ICD-10-CM | POA: Diagnosis not present

## 2017-05-16 DIAGNOSIS — M9903 Segmental and somatic dysfunction of lumbar region: Secondary | ICD-10-CM | POA: Diagnosis not present

## 2017-05-16 DIAGNOSIS — M9904 Segmental and somatic dysfunction of sacral region: Secondary | ICD-10-CM | POA: Diagnosis not present

## 2017-05-16 DIAGNOSIS — M5441 Lumbago with sciatica, right side: Secondary | ICD-10-CM | POA: Diagnosis not present

## 2017-05-23 DIAGNOSIS — M9905 Segmental and somatic dysfunction of pelvic region: Secondary | ICD-10-CM | POA: Diagnosis not present

## 2017-05-23 DIAGNOSIS — M9903 Segmental and somatic dysfunction of lumbar region: Secondary | ICD-10-CM | POA: Diagnosis not present

## 2017-05-23 DIAGNOSIS — M5441 Lumbago with sciatica, right side: Secondary | ICD-10-CM | POA: Diagnosis not present

## 2017-05-23 DIAGNOSIS — M9904 Segmental and somatic dysfunction of sacral region: Secondary | ICD-10-CM | POA: Diagnosis not present

## 2017-06-05 ENCOUNTER — Ambulatory Visit: Payer: BLUE CROSS/BLUE SHIELD | Admitting: Family Medicine

## 2017-06-06 ENCOUNTER — Encounter: Payer: Self-pay | Admitting: Family Medicine

## 2017-06-06 ENCOUNTER — Other Ambulatory Visit: Payer: Self-pay | Admitting: Family Medicine

## 2017-06-06 ENCOUNTER — Ambulatory Visit (INDEPENDENT_AMBULATORY_CARE_PROVIDER_SITE_OTHER): Payer: BLUE CROSS/BLUE SHIELD | Admitting: Family Medicine

## 2017-06-06 VITALS — BP 138/78 | HR 60 | Temp 98.3°F | Resp 16 | Ht 67.0 in | Wt 232.6 lb

## 2017-06-06 DIAGNOSIS — E034 Atrophy of thyroid (acquired): Secondary | ICD-10-CM | POA: Diagnosis not present

## 2017-06-06 DIAGNOSIS — Z8639 Personal history of other endocrine, nutritional and metabolic disease: Secondary | ICD-10-CM

## 2017-06-06 DIAGNOSIS — R030 Elevated blood-pressure reading, without diagnosis of hypertension: Secondary | ICD-10-CM

## 2017-06-06 DIAGNOSIS — E669 Obesity, unspecified: Secondary | ICD-10-CM

## 2017-06-06 DIAGNOSIS — R7303 Prediabetes: Secondary | ICD-10-CM

## 2017-06-06 DIAGNOSIS — Z0000 Encounter for general adult medical examination without abnormal findings: Secondary | ICD-10-CM

## 2017-06-06 DIAGNOSIS — E7800 Pure hypercholesterolemia, unspecified: Secondary | ICD-10-CM

## 2017-06-06 NOTE — Assessment & Plan Note (Signed)
Mildly elevated BP in range pre-HTN 135-139 - Home BP readings none  No known complications   Plan:  1. Discussed dx Pre-HTN vs HTN - reviewed newer guidelines BP goal < 135/85, and concern that she may develop HTN in future 2. Encourage improved lifestyle - low sodium diet, regular exercise 3. Start monitor BP outside office, bring readings to next visit, if persistently >140/90 or new symptoms notify office sooner 4. Follow-up q 3 mo - f/u thyroid labs

## 2017-06-06 NOTE — Assessment & Plan Note (Signed)
Still gradual wt gain, poor lifestyle still non adherence to exercise / diet Seems mood and thyroid seem more controlled now, but still poor lifestyle Encouraged improve diet/exercise review strategies Offered referral again in future for [facility], deferred for now

## 2017-06-06 NOTE — Assessment & Plan Note (Addendum)
Seems to be controlled based on last thyroid function Uncertain etiology of some her symptoms with underlying deconditioning, motivation, mood (despite negative screen), limited lifestyle, poor sleep - TSH trend 6 > 1.12 (02/2017)  Plan: 1. Repeat TSH and Free T4 in 1 week as advised before this apt - then again labs in 3 months before annual physical 2. Continue Levothyroxine daily for now - adjust dose as needed 3. Follow-up in 3 months - future consider [facility] for further discussion on thyroid management if sub-optimal control still

## 2017-06-06 NOTE — Patient Instructions (Addendum)
Thank you for coming to the clinic today.  1.  Continue thyroid medicine for now, not sure exactly if we need to change or not because we are due for lab test  2. For blood pressure, mildly elevated, try to start checking BP at home 1-2x weekly, record the readings, goal is < 135/85  SCHEDULE "Lab Only" visit in the morning at the clinic for lab draw in 1-2 WEEKS NON FASTING then 3 months FASTING   - Make sure Lab Only appointment is at about 1 week before your next appointment, so that results will be available  For Lab Results, once available within 2-3 days of blood draw, you can can log in to MyChart online to view your results and a brief explanation. Also, we can discuss results at next follow-up visit.  Please schedule a Follow-up Appointment to: Return in about 3 months (around 09/06/2017) for Annual Physical.  If you have any other questions or concerns, please feel free to call the clinic or send a message through MyChart. You may also schedule an earlier appointment if necessary.  Additionally, you may be receiving a survey about your experience at our clinic within a few days to 1 week by e-mail or mail. We value your feedback.  Saralyn Pilar, DO [facility], [place]

## 2017-06-06 NOTE — Progress Notes (Signed)
Subjective:    Patient ID: Cindy Mccoy, female    DOB: November 06, 1960, 56 y.o.   MRN: [id number]  Cindy Mccoy is a 56 y.o. female presenting on 06/06/2017 for Hypertension and Hypothyroidism   HPI   FOLLOW-UP HYPOTHYROIDISM - Last visit with me 02/27/17, for initial visit with me to discuss same problem, treated with re-check thyroid function and ultimately results were all within normal range on Levothyroxine despite prior abnormal result in 08/2016 with elevated TSH, her dose was refilled, no referral to endocrinology made as patient did not request it, see prior notes for background information. - Interval update with no new updates, she did not return for repeat thyroid labs in 3 months from our last visit - Today patient reports overall uncertain if improved still, unable to provide clear answer, still feels some reduced energy and less active, problems with weight gain, see below - Continues to take Levothyroxine daily - Admits insomnia - Denies temperature changes, hair or nail changes, appetite changes, abdominal pain, constipation, diarrhea, dyspnea  ELEVATED BP without HTN - Reports prior history of mild elevated BP, has not checked BP regularly since last visit, chart review shows several 135-139 readings - She admits can check BP at home 1-2x weekly - denies HA, CP, dyspnea, edema  OBESITY BMI >35 - Last visit with me 02/27/17, for initial visit for same problem, treated with checked thyroid function, continued same dose Levothyroxine with nml thyroid, and offered referral to [facility] Dr Dalbert Garnet but she declined and wanted to proceed with lifestyle interventions first, see prior notes for background information. - Interval update with wt gain +4-5 lbs in 3 months - Today patient reports concern cannot lose weight despite exercise, now limited more by motivation and energy, also with pain intermittently - Prior A1c 5.5   Depression screen  [facility] 2/9 06/06/2017 09/05/2016 05/12/2016  Decreased Interest 0 0 0  Down, Depressed, Hopeless 0 0 0  PHQ - 2 Score 0 0 0    Social History  Substance Use Topics  . Smoking status: Former Games developer  . Smokeless tobacco: Never Used     Comment: 29 pack per year history, quit several years ago  . Alcohol use No     Comment: 1-2 drinks per month    Review of Systems Per HPI unless specifically indicated above     Objective:    BP 138/78   Pulse 60   Temp 98.3 F (36.8 C) (Oral)   Resp 16   Ht  (1.702 m)   Wt 232 lb 9.6 oz (105.5 kg)   LMP 09/23/2012   BMI 36.43 kg/m   Wt Readings from Last 3 Encounters:  06/06/17 232 lb 9.6 oz (105.5 kg)  02/27/17 228 lb (103.4 kg)  09/05/16 227 lb (103 kg)    Physical Exam  Constitutional: She is oriented to person, place, and time. She appears well-developed and well-nourished. No distress.  Well-appearing, comfortable, cooperative, obese  HENT:  Head: Normocephalic and atraumatic.  Mouth/Throat: Oropharynx is clear and moist.  Eyes: Conjunctivae are normal. Right eye exhibits no discharge. Left eye exhibits no discharge.  Neck: Normal range of motion. Neck supple. No thyromegaly present.  Cardiovascular: Normal rate, regular rhythm, normal heart sounds and intact distal pulses.   No murmur heard. Pulmonary/Chest: Effort normal and breath sounds normal. No respiratory distress. She has no wheezes. She has no rales.  Musculoskeletal: She exhibits no edema.  Lymphadenopathy:    She has  no cervical adenopathy.  Neurological: She is alert and oriented to person, place, and time.  Skin: Skin is warm and dry. No rash noted. She is not diaphoretic. No erythema.  Psychiatric: She has a normal mood and affect. Her behavior is normal.  Well groomed, good eye contact, normal speech and thoughts  Nursing note and vitals reviewed.  Results for orders placed or performed in visit on 02/27/17  TSH  Result Value Ref Range   TSH 1.12 mIU/L    T4, free  Result Value Ref Range   Free T4 1.6 0.8 - 1.8 ng/dL  T3  Result Value Ref Range   T3, Total 115.0 76 - 181 ng/dL  Hemoglobin O9G  Result Value Ref Range   Hgb A1c MFr Bld 5.5 <5.7 %   Mean Plasma Glucose 111 mg/dL      Assessment & Plan:   Problem List Items Addressed This Visit    Hypothyroidism - Primary    Seems to be controlled based on last thyroid function Uncertain etiology of some her symptoms with underlying deconditioning, motivation, mood (despite negative screen), limited lifestyle, poor sleep - TSH trend 6 > 1.12 (02/2017)  Plan: 1. Repeat TSH and Free T4 in 1 week as advised before this apt - then again labs in 3 months before annual physical 2. Continue Levothyroxine daily for now - adjust dose as needed 3. Follow-up in 3 months - future consider [facility] for further discussion on thyroid management if sub-optimal control still      Relevant Orders   TSH   T4, free   Obesity (BMI 35.0-39.9 without comorbidity)    Still gradual wt gain, poor lifestyle still non adherence to exercise / diet Seems mood and thyroid seem more controlled now, but still poor lifestyle Encouraged improve diet/exercise review strategies Offered referral again in future for [facility], deferred for now      Pre-hypertension    Mildly elevated BP in range pre-HTN 135-139 - Home BP readings none  No known complications   Plan:  1. Discussed dx Pre-HTN vs HTN - reviewed newer guidelines BP goal < 135/85, and concern that she may develop HTN in future 2. Encourage improved lifestyle - low sodium diet, regular exercise 3. Start monitor BP outside office, bring readings to next visit, if persistently >140/90 or new symptoms notify office sooner 4. Follow-up q 3 mo - f/u thyroid labs          No orders of the defined types were placed in this encounter.   Follow up plan: Return in about 3 months (around 09/06/2017) for Annual Physical.  Future  labs ordered TSH, Free T4 in 1 week, then follow-up 3 months with repeat fasting labs for annual physical.  Saralyn Pilar, DO [facility] 06/06/2017, 11:38 PM

## 2017-06-09 ENCOUNTER — Other Ambulatory Visit: Payer: Self-pay

## 2017-06-09 DIAGNOSIS — E034 Atrophy of thyroid (acquired): Secondary | ICD-10-CM | POA: Diagnosis not present

## 2017-06-09 LAB — T4, FREE: Free T4: 1.4 ng/dL (ref 0.8–1.8)

## 2017-06-09 LAB — TSH: TSH: 8.55 mIU/L — ABNORMAL HIGH (ref 0.40–4.50)

## 2017-06-12 ENCOUNTER — Other Ambulatory Visit: Payer: Self-pay | Admitting: Family Medicine

## 2017-06-12 DIAGNOSIS — E034 Atrophy of thyroid (acquired): Secondary | ICD-10-CM

## 2017-06-12 MED ORDER — LEVOTHYROXINE SODIUM 150 MCG PO TABS: 150.0000 ug | ORAL_TABLET | Freq: Every day | ORAL | 3 refills | Status: DC

## 2017-09-05 ENCOUNTER — Other Ambulatory Visit: Payer: BLUE CROSS/BLUE SHIELD

## 2017-09-05 DIAGNOSIS — E034 Atrophy of thyroid (acquired): Secondary | ICD-10-CM

## 2017-09-05 DIAGNOSIS — Z0000 Encounter for general adult medical examination without abnormal findings: Secondary | ICD-10-CM

## 2017-09-05 DIAGNOSIS — R030 Elevated blood-pressure reading, without diagnosis of hypertension: Secondary | ICD-10-CM

## 2017-09-05 DIAGNOSIS — E7800 Pure hypercholesterolemia, unspecified: Secondary | ICD-10-CM

## 2017-09-05 DIAGNOSIS — Z8639 Personal history of other endocrine, nutritional and metabolic disease: Secondary | ICD-10-CM

## 2017-09-05 DIAGNOSIS — E669 Obesity, unspecified: Secondary | ICD-10-CM

## 2017-09-05 DIAGNOSIS — R7303 Prediabetes: Secondary | ICD-10-CM

## 2017-09-06 LAB — COMPLETE METABOLIC PANEL WITH GFR
AG Ratio: 1.6 (calc) (ref 1.0–2.5)
ALT: 14 U/L (ref 6–29)
AST: 12 U/L (ref 10–35)
Albumin: 4.1 g/dL (ref 3.6–5.1)
Alkaline phosphatase (APISO): 68 U/L (ref 33–130)
BUN: 14 mg/dL (ref 7–25)
CO2: 30 mmol/L (ref 20–32)
Calcium: 9.2 mg/dL (ref 8.6–10.4)
Chloride: 106 mmol/L (ref 98–110)
Creat: 0.79 mg/dL (ref 0.50–1.05)
GFR, Est African American: 97 mL/min/{1.73_m2} (ref >=60)
GFR, Est Non African American: 84 mL/min/{1.73_m2} (ref >=60)
Globulin: 2.6 g/dL (calc) (ref 1.9–3.7)
Glucose, Bld: 92 mg/dL (ref 65–99)
Potassium: 4.4 mmol/L (ref 3.5–5.3)
Sodium: 143 mmol/L (ref 135–146)
Total Bilirubin: 0.4 mg/dL (ref 0.2–1.2)
Total Protein: 6.7 g/dL (ref 6.1–8.1)

## 2017-09-06 LAB — LIPID PANEL
Cholesterol: 166 mg/dL (ref <200)
HDL: 52 mg/dL (ref >50)
LDL Cholesterol (Calc): 92 mg/dL (calc)
Non-HDL Cholesterol (Calc): 114 mg/dL (calc) (ref <130)
Total CHOL/HDL Ratio: 3.2 (calc) (ref <5.0)
Triglycerides: 121 mg/dL (ref <150)

## 2017-09-06 LAB — CBC WITH DIFFERENTIAL/PLATELET
Basophils Absolute: 29 cells/uL (ref 0–200)
Basophils Relative: 0.6 %
Eosinophils Absolute: 221 cells/uL (ref 15–500)
Eosinophils Relative: 4.5 %
HCT: 43.6 % (ref 35.0–45.0)
Hemoglobin: 14.5 g/dL (ref 11.7–15.5)
Lymphs Abs: 1548 cells/uL (ref 850–3900)
MCH: 28.9 pg (ref 27.0–33.0)
MCHC: 33.3 g/dL (ref 32.0–36.0)
MCV: 87 fL (ref 80.0–100.0)
MPV: 11.4 fL (ref 7.5–12.5)
Monocytes Relative: 8.8 %
Neutro Abs: 2671 cells/uL (ref 1500–7800)
Neutrophils Relative %: 54.5 %
Platelets: 229 10*3/uL (ref 140–400)
RBC: 5.01 10*6/uL (ref 3.80–5.10)
RDW: 12.8 % (ref 11.0–15.0)
Total Lymphocyte: 31.6 %
WBC mixed population: 431 cells/uL (ref 200–950)
WBC: 4.9 10*3/uL (ref 3.8–10.8)

## 2017-09-06 LAB — TSH: TSH: 0.44 mIU/L (ref 0.40–4.50)

## 2017-09-06 LAB — HEMOGLOBIN A1C
Hgb A1c MFr Bld: 5.5 % of total Hgb (ref <5.7)
Mean Plasma Glucose: 111 (calc)
eAG (mmol/L): 6.2 (calc)

## 2017-09-06 LAB — VITAMIN D 25 HYDROXY (VIT D DEFICIENCY, FRACTURES): Vit D, 25-Hydroxy: 36 ng/mL (ref 30–100)

## 2017-09-06 LAB — T4, FREE: Free T4: 2 ng/dL — ABNORMAL HIGH (ref 0.8–1.8)

## 2017-09-12 ENCOUNTER — Ambulatory Visit (INDEPENDENT_AMBULATORY_CARE_PROVIDER_SITE_OTHER): Payer: BLUE CROSS/BLUE SHIELD | Admitting: Family Medicine

## 2017-09-12 ENCOUNTER — Encounter: Payer: Self-pay | Admitting: Family Medicine

## 2017-09-12 ENCOUNTER — Other Ambulatory Visit: Payer: Self-pay | Admitting: Family Medicine

## 2017-09-12 VITALS — BP 132/85 | HR 66 | Temp 97.9°F | Resp 16 | Ht 67.0 in | Wt 232.0 lb

## 2017-09-12 DIAGNOSIS — Z0000 Encounter for general adult medical examination without abnormal findings: Secondary | ICD-10-CM

## 2017-09-12 DIAGNOSIS — E034 Atrophy of thyroid (acquired): Secondary | ICD-10-CM

## 2017-09-12 DIAGNOSIS — R030 Elevated blood-pressure reading, without diagnosis of hypertension: Secondary | ICD-10-CM | POA: Diagnosis not present

## 2017-09-12 DIAGNOSIS — E7800 Pure hypercholesterolemia, unspecified: Secondary | ICD-10-CM

## 2017-09-12 DIAGNOSIS — R7303 Prediabetes: Secondary | ICD-10-CM | POA: Diagnosis not present

## 2017-09-12 DIAGNOSIS — Z0001 Encounter for general adult medical examination with abnormal findings: Secondary | ICD-10-CM | POA: Diagnosis not present

## 2017-09-12 DIAGNOSIS — E669 Obesity, unspecified: Secondary | ICD-10-CM

## 2017-09-12 DIAGNOSIS — R7309 Other abnormal glucose: Secondary | ICD-10-CM

## 2017-09-12 MED ORDER — LEVOTHYROXINE SODIUM 137 MCG PO TABS: 137.0000 ug | ORAL_TABLET | Freq: Every day | ORAL | 3 refills | Status: DC

## 2017-09-12 NOTE — Assessment & Plan Note (Signed)
Well-controlled Pre-DM with A1c 5.5, stable without change, >1-2 years now Concern with obesity, Pre-HTN, HLD  Plan:  1. Not on any therapy currently  2. Encourage improved lifestyle - low carb, low sugar diet, reduce portion size, start regular exercise 3. Follow-up 3 months thyroid / monitor A1c q 3-6 months can re-check again with upcoming labs

## 2017-09-12 NOTE — Assessment & Plan Note (Signed)
Without any significant wt loss, poor lifestyle still non adherence to exercise / diet, not successful in past Now too high dose Levothyroxine for hypothyroidism, adjusting med Encouraged improve diet/exercise review strategies  Offered referral to Dr Baruch GoutyBeasley [facility] - suspect BP will be controlled with some wt loss - she will explore this option notify us if need formal referral

## 2017-09-12 NOTE — Assessment & Plan Note (Addendum)
Still with borderline elevated BP in range pre-HTN 135-139 - Home BP readings improved to stable No known complications / Hypothyroidism difficult to control on med  Plan:  1. Discussed dx Pre-HTN vs HTN - reviewed newer guidelines BP goal < 135/85, and concern that she may develop HTN in future 2. Encourage improved lifestyle - low sodium diet, regular exercise 3. Continue to monitor BP outside office, bring readings to next visit, if persistently >140/90 or new symptoms notify office sooner 4. Follow-up q 3 mo - f/u thyroid labs  Of[facility] - suspect BP will be controlled with some wt loss - she will explore this option notify us if need formal referral

## 2017-09-12 NOTE — Progress Notes (Signed)
Subjective:    Patient ID: Cindy Mccoy, female    DOB: Oct 06, 1960, 57 y.o.   MRN: [id number]  Favor Kreh is a 57 y.o. female presenting on 09/12/2017 for Annual Exam and Hypothyroidism   HPI   Here for Annual Physical and Lab Review  FOLLOW-UP HYPOTHYROIDISM - Last visit with me 06/06/17, for same problem, see prior notes for background information. - Interval update last TSH trend from 1.12 to 8.55, then she was inc from up to daily in October 2018, and offered referral to [facility] but she declined referral at that time, did not want to go. Now last lab done 09/05/17 with TSH down to 0.44 now, and Free T4 was in normal range now it is higher at 2.0 - Today patient reports she feels like her thyroid hormone is now too high, which makes sense with her lab results, it seems her optimal dose is between last two, 125 and 150, she now does not have reduced energy, diffcult to put into words but feels "off" sometimes temp instability or energy feels off - Still problem with weight loss - Taking Levothyroxine daily at this time - Admits insomnia  ELEVATED BP without HTN / Pre-HTN - Reports she has checked BP occasionally seems to still be in 130s range, no recent checks - Never on anti-HTN meds  Elevated A1c / OBESITY BMI >36 - Last visit with me 06/06/17, see background - She was not ready for referral at that time for further assistance with weight, discussed Dr Baruch Gouty [facility] - Today weight stable in past 3-4 months - She has had problems with a few muscle / joint injuries in past few months, now healing and improved, did not seek medical care, had accidental problem - She has not resume regular exercise yet but knows she needs to - Last A1c 5.5, now recent check on labs 5.5 - Normal cholesterol on labs - Fam history of difficulty losing weight, with obesity in family  Health Maintenance: UTD Last Mammogram 10/13/16, due after Feb 2019,  will return to [facility] UTD Pap smear UTD Colonoscopy UTD Flu Vaccine 04/2017  Depression screen [facility] 2/9 09/12/2017 06/06/2017 09/05/2016  Decreased Interest 0 0 0  Down, Depressed, Hopeless 0 0 0  PHQ - 2 Score 0 0 0    Past Medical History:  Diagnosis Date  . Anxiety   . Breast mass, left   . Collapsed lung 2005  . Dermatitis    contact  . Family history of malignant neoplasm of breast   . History of chicken pox   . Pure hypercholesterolemia   . Unspecified menopausal and postmenopausal disorder    Past Surgical History:  Procedure Laterality Date  . BREAST BIOPSY  1981   Social History   Socioeconomic History  . Marital status: Married    Spouse name: Not on file  . Number of children: Not on file  . Years of education: Not on file  . Highest education level: Not on file  Social Needs  . Financial resource strain: Not on file  . Food insecurity - worry: Not on file  . Food insecurity - inability: Not on file  . Transportation needs - medical: Not on file  . Transportation needs - non-medical: Not on file  Occupational History  . Occupation: CNA  Tobacco Use  . Smoking status: Former Games developer  . Smokeless tobacco: Never Used  . Tobacco comment: 29 pack per year history, quit several  years ago  Substance and Sexual Activity  . Alcohol use: No    Comment: 1-2 drinks per month  . Drug use: No    Comment: Remote marijuana in highschool- no injections  . Sexual activity: Not on file  Other Topics Concern  . Not on file  Social History Narrative  . Not on file   Family History  Problem Relation Age of Onset  . Cancer Mother        breast  . COPD Mother   . Heart disease Father        CHF   Current Outpatient Medications on File Prior to Visit  Medication Sig  . Multiple Vitamin (MULTIVITAMIN) tablet Take 1 tablet by mouth daily.  Marland Kitchen. omeprazole (PRILOSEC) 20 MG capsule TAKE 1 CAPSULE BY MOUTH DAILY   No current facility-administered medications on file  prior to visit.     Review of Systems  Constitutional: Negative for activity change, appetite change, chills, diaphoresis, fatigue, fever and unexpected weight change.  HENT: Negative for congestion, hearing loss and sinus pressure.   Eyes: Negative for visual disturbance.  Respiratory: Negative for apnea, cough, choking, chest tightness, shortness of breath and wheezing.   Cardiovascular: Negative for chest pain, palpitations and leg swelling.  Gastrointestinal: Negative for abdominal pain, anal bleeding, blood in stool, constipation, diarrhea, nausea and vomiting.  Endocrine: Negative for cold intolerance and polyuria.       Some temperature instability  Genitourinary: Negative for decreased urine volume, difficulty urinating, dysuria, frequency, hematuria and urgency.  Musculoskeletal: Negative for arthralgias, back pain and neck pain.  Skin: Negative for rash.  Allergic/Immunologic: Negative for environmental allergies.  Neurological: Negative for dizziness, weakness, light-headedness, numbness and headaches.  Hematological: Negative for adenopathy.  Psychiatric/Behavioral: Negative for behavioral problems, dysphoric mood and sleep disturbance. The patient is not nervous/anxious.    Per HPI unless specifically indicated above     Objective:    BP 132/85   Pulse 66   Temp 97.9 F (36.6 C) (Oral)   Resp 16   Ht 5\' 7"  (1.702 m)   Wt 232 lb (105.2 kg)   LMP 09/23/2012   BMI 36.34 kg/m   Wt Readings from Last 3 Encounters:  09/12/17 232 lb (105.2 kg)  06/06/17 232 lb 9.6 oz (105.5 kg)  02/27/17 228 lb (103.4 kg)    Physical Exam  Constitutional: She is oriented to person, place, and time. She appears well-developed and well-nourished. No distress.  Well-appearing, comfortable, cooperative, obese  HENT:  Head: Normocephalic and atraumatic.  Mouth/Throat: Oropharynx is clear and moist.  Eyes: Conjunctivae and EOM are normal. Pupils are equal, round, and reactive to light.  Right eye exhibits no discharge. Left eye exhibits no discharge.  Neck: Normal range of motion. Neck supple. No thyromegaly present.  Cardiovascular: Normal rate, regular rhythm, normal heart sounds and intact distal pulses.  No murmur heard. Pulmonary/Chest: Effort normal and breath sounds normal. No respiratory distress. She has no wheezes. She has no rales.  Abdominal: Soft. Bowel sounds are normal. She exhibits no distension and no mass. There is no tenderness.  Musculoskeletal: Normal range of motion. She exhibits no edema or tenderness.  Upper / Lower Extremities: - Normal muscle tone, strength bilateral upper extremities 5/5, lower extremities 5/5  Lymphadenopathy:    She has no cervical adenopathy.  Neurological: She is alert and oriented to person, place, and time.  Distal sensation intact to light touch all extremities  Skin: Skin is warm and dry. No rash  noted. She is not diaphoretic. No erythema.  Psychiatric: She has a normal mood and affect. Her behavior is normal.  Well groomed, good eye contact, normal speech and thoughts  Nursing note and vitals reviewed.  Results for orders placed or performed in visit on 09/05/17  VITAMIN D 25 Hydroxy (Vit-D Deficiency, Fractures)  Result Value Ref Range   Vit D, 25-Hydroxy 36 30 - 100 ng/mL  T4, free  Result Value Ref Range   Free T4 2.0 (H) 0.8 - 1.8 ng/dL  TSH  Result Value Ref Range   TSH 0.44 0.40 - 4.50 mIU/L  CBC with Differential/Platelet  Result Value Ref Range   WBC 4.9 3.8 - 10.8 Thousand/uL   RBC 5.01 3.80 - 5.10 Million/uL   Hemoglobin 14.5 11.7 - 15.5 g/dL   HCT 16.1 09.6 - 04.5 %   MCV 87.0 80.0 - 100.0 fL   MCH 28.9 27.0 - 33.0 pg   MCHC 33.3 32.0 - 36.0 g/dL   RDW 40.9 81.1 - 91.4 %   Platelets 229 140 - 400 Thousand/uL   MPV 11.4 7.5 - 12.5 fL   Neutro Abs 2,671 1,500 - 7,800 cells/uL   Lymphs Abs 1,548 850 - 3,900 cells/uL   WBC mixed population 431 200 - 950 cells/uL   Eosinophils Absolute 221 15 - 500  cells/uL   Basophils Absolute 29 0 - 200 cells/uL   Neutrophils Relative % 54.5 %   Total Lymphocyte 31.6 %   Monocytes Relative 8.8 %   Eosinophils Relative 4.5 %   Basophils Relative 0.6 %  Hemoglobin A1c  Result Value Ref Range   Hgb A1c MFr Bld 5.5 <5.7 % of total Hgb   Mean Plasma Glucose 111 (calc)   eAG (mmol/L) 6.2 (calc)  Lipid panel  Result Value Ref Range   Cholesterol 166 <200 mg/dL   HDL 52 >78 mg/dL   Triglycerides 295 <621 mg/dL   LDL Cholesterol (Calc) 92 mg/dL (calc)   Total CHOL/HDL Ratio 3.2 <5.0 (calc)   Non-HDL Cholesterol (Calc) 114 <130 mg/dL (calc)  COMPLETE METABOLIC PANEL WITH GFR  Result Value Ref Range   Glucose, Bld 92 65 - 99 mg/dL   BUN 14 7 - 25 mg/dL   Creat 3.08 6.57 - 8.46 mg/dL   GFR, Est Non African American 84 > OR = 60 mL/min/1.43m2   GFR, Est African American 97 > OR = 60 mL/min/1.40m2   BUN/Creatinine Ratio NOT APPLICABLE 6 - 22 (calc)   Sodium 143 135 - 146 mmol/L   Potassium 4.4 3.5 - 5.3 mmol/L   Chloride 106 98 - 110 mmol/L   CO2 30 20 - 32 mmol/L   Calcium 9.2 8.6 - 10.4 mg/dL   Total Protein 6.7 6.1 - 8.1 g/dL   Albumin 4.1 3.6 - 5.1 g/dL   Globulin 2.6 1.9 - 3.7 g/dL (calc)   AG Ratio 1.6 1.0 - 2.5 (calc)   Total Bilirubin 0.4 0.2 - 1.2 mg/dL   Alkaline phosphatase (APISO) 68 33 - 130 U/L   AST 12 10 - 35 U/L   ALT 14 6 - 29 U/L      Assessment & Plan:   Problem List Items Addressed This Visit    Hypothyroidism    Elevated thyroid hormone now, with significant change from high TSH down to low 0.44, and high Free T4, suggestive of dose levothyroxine too high, suspect optimal dose is between 125 and 150 - TSH trend 6 > 1.12 > 8.55 > 0.44 (  08/2017)  Plan: 1. REDUCE Levothyroxine from to daily - new rx sent 2. Repeat TSH and Free T4 again in 3 months  3. Follow-up in 3 months - future consider [facility] for further discussion on thyroid management if sub-optimal control still      Relevant  Medications   levothyroxine (SYNTHROID, LEVOTHROID) 137 MCG tablet   Obesity (BMI 35.0-39.9 without comorbidity)    Without any significant wt loss, poor lifestyle still non adherence to exercise / diet, not successful in past Now too high dose Levothyroxine for hypothyroidism, adjusting med Encouraged improve diet/exercise review strategies  Offered referral to Dr Baruch Gouty [facility] - suspect BP will be controlled with some wt loss - she will explore this option notify us if need formal referral      Pre-hypertension    Still with borderline elevated BP in range pre-HTN 135-139 - Home BP readings improved to stable No known complications / Hypothyroidism difficult to control on med  Plan:  1. Discussed dx Pre-HTN vs HTN - reviewed newer guidelines BP goal < 135/85, and concern that she may develop HTN in future 2. Encourage improved lifestyle - low sodium diet, regular exercise 3. Continue to monitor BP outside office, bring readings to next visit, if persistently >140/90 or new symptoms notify office sooner 4. Follow-up q 3 mo - f/u thyroid labs  Offered referral to Dr Baruch Gouty [facility] - suspect BP will be controlled with some wt loss - she will explore this option notify us if need formal referral      Prediabetes    Well-controlled Pre-DM with A1c 5.5, stable without change, >1-2 years now Concern with obesity, Pre-HTN, HLD  Plan:  1. Not on any therapy currently  2. Encourage improved lifestyle - low carb, low sugar diet, reduce portion size, start regular exercise 3. Follow-up 3 months thyroid / monitor A1c q 3-6 months can re-check again with upcoming labs      PURE HYPERCHOLESTEROLEMIA    Controlled cholesterol on lifestyle / likely genetic component of good cholesterol, not on statin, limited exercise now Last lipid panel 08/2017 Calculated ASCVD 10 yr risk score 2%  Plan: 1. In future consider ASA 81mg  for primary ASCVD risk reduction 2. Encourage  improved lifestyle - low carb/cholesterol, reduce portion size, start regular exercise 3. Follow-up thyroid / and wt in 3 months       Other Visit Diagnoses    Annual physical exam    -  Primary      Meds ordered this encounter  Medications  . levothyroxine (SYNTHROID, LEVOTHROID) 137 MCG tablet    Sig: Take 1 tablet (137 mcg total) by mouth daily before breakfast.    Dispense:  90 tablet    Refill:  3    Dose change from down to    Follow up plan: Return in about 3 months (around 12/11/2017) for Thyroid f/u (lab review).  Future lab orders placed for 11/2017  Saralyn Pilar, DO [facility] 09/12/2017, 1:20 PM

## 2017-09-12 NOTE — Patient Instructions (Addendum)
Thank you for coming to the office today.  1.  TSH mildly low, but Free T4 a little too high - I think you need a lower dose Levothyroxine between last 2 doses, now take Levothyroxine 137mg  daily  Go ahead and check it out, if you need a referral let me know.  WEIGHT MANAGEMENT  Dr Quillian Quincearen Beasley  [facility] [address] [place], [place] [address] Ph: [phone]  2. DUE for FASTING BLOOD WORK (no food or drink after midnight before the lab appointment, only water or coffee without cream/sugar on the morning of)  SCHEDULE "Lab Only" visit in the morning at the clinic for lab draw in 3 MONTHS   - Make sure Lab Only appointment is at about 1 week before your next appointment, so that results will be available  For Lab Results, once available within 2-3 days of blood draw, you can can log in to MyChart online to view your results and a brief explanation. Also, we can discuss results at next follow-up visit.   Please schedule a Follow-up Appointment to: Return in about 3 months (around 12/11/2017) for Thyroid f/u (lab review).  If you have any other questions or concerns, please feel free to call the office or send a message through MyChart. You may also schedule an earlier appointment if necessary.  Additionally, you may be receiving a survey about your experience at our office within a few days to 1 week by e-mail or mail. We value your feedback.  Saralyn PilarAlexander Karamalegos, DO [facility]

## 2017-09-12 NOTE — Assessment & Plan Note (Signed)
Elevated thyroid hormone now, with significant change from high TSH down to low 0.44, and high Free T4, suggestive of dose levothyroxine too high, suspect optimal dose is between 125 and 150 - TSH trend 6 > 1.12 > 8.55 > 0.44 (08/2017)  Plan: 1. REDUCE Levothyroxine from 150mcg to 137mcg daily - new rx sent 2. Repeat TSH and Free T4 again in 3 months  3. Follow-up in 3 months - [facility] for further discussion on thyroid management if sub-optimal control still

## 2017-09-12 NOTE — Assessment & Plan Note (Addendum)
Controlled cholesterol on lifestyle / likely genetic component of good cholesterol, not on statin, limited exercise now Last lipid panel 08/2017 Calculated ASCVD 10 yr risk score 2%  Plan: 1. In future consider ASA 81mg  for primary ASCVD risk reduction 2. Encourage improved lifestyle - low carb/cholesterol, reduce portion size, start regular exercise 3. Follow-up thyroid / and wt in 3 months

## 2017-09-20 ENCOUNTER — Other Ambulatory Visit: Payer: Self-pay | Admitting: Family Medicine

## 2017-09-20 DIAGNOSIS — Z1231 Encounter for screening mammogram for malignant neoplasm of breast: Secondary | ICD-10-CM

## 2017-10-16 ENCOUNTER — Ambulatory Visit
Admission: RE | Admit: 2017-10-16 | Discharge: 2017-10-16 | Disposition: A | Discharge status: Home / Self Care | Payer: BLUE CROSS/BLUE SHIELD | Source: Ambulatory Visit | Attending: Family Medicine | Admitting: Family Medicine

## 2017-10-16 DIAGNOSIS — Z1231 Encounter for screening mammogram for malignant neoplasm of breast: Secondary | ICD-10-CM

## 2017-12-09 ENCOUNTER — Other Ambulatory Visit: Payer: Self-pay | Admitting: Family Medicine

## 2017-12-09 DIAGNOSIS — K219 Gastro-esophageal reflux disease without esophagitis: Secondary | ICD-10-CM

## 2017-12-12 ENCOUNTER — Other Ambulatory Visit: Payer: BLUE CROSS/BLUE SHIELD

## 2017-12-12 DIAGNOSIS — E034 Atrophy of thyroid (acquired): Secondary | ICD-10-CM

## 2017-12-12 DIAGNOSIS — R7309 Other abnormal glucose: Secondary | ICD-10-CM

## 2017-12-12 LAB — TSH: TSH: 0.57 mIU/L (ref 0.40–4.50)

## 2017-12-12 LAB — T4, FREE: Free T4: 1.8 ng/dL (ref 0.8–1.8)

## 2017-12-13 LAB — HEMOGLOBIN A1C
Hgb A1c MFr Bld: 5.5 % of total Hgb (ref <5.7)
Mean Plasma Glucose: 111 (calc)
eAG (mmol/L): 6.2 (calc)

## 2017-12-18 ENCOUNTER — Ambulatory Visit: Payer: BLUE CROSS/BLUE SHIELD | Admitting: Family Medicine

## 2017-12-19 ENCOUNTER — Ambulatory Visit (INDEPENDENT_AMBULATORY_CARE_PROVIDER_SITE_OTHER): Payer: BLUE CROSS/BLUE SHIELD | Admitting: Family Medicine

## 2017-12-19 ENCOUNTER — Encounter: Payer: Self-pay | Admitting: Family Medicine

## 2017-12-19 VITALS — BP 134/82 | HR 62 | Temp 98.0°F | Resp 16 | Ht 67.0 in | Wt 231.0 lb

## 2017-12-19 DIAGNOSIS — R030 Elevated blood-pressure reading, without diagnosis of hypertension: Secondary | ICD-10-CM | POA: Diagnosis not present

## 2017-12-19 DIAGNOSIS — R7303 Prediabetes: Secondary | ICD-10-CM

## 2017-12-19 DIAGNOSIS — E669 Obesity, unspecified: Secondary | ICD-10-CM

## 2017-12-19 DIAGNOSIS — K219 Gastro-esophageal reflux disease without esophagitis: Secondary | ICD-10-CM | POA: Diagnosis not present

## 2017-12-19 DIAGNOSIS — E034 Atrophy of thyroid (acquired): Secondary | ICD-10-CM

## 2017-12-19 NOTE — Assessment & Plan Note (Signed)
Well-controlled Pre-DM with A1c 5.5, stable without change, >1-2 years now Concern with obesity, Pre-HTN, HLD  Plan:  1. Not on any therapy currently - remain off 2. Encourage improved lifestyle - low carb, low sugar diet, reduce portion size, start regular exercise 3. Follow-up 6 months thyroid / monitor A1c - by next year 2020 if A1c still stable can check only q 6 month

## 2017-12-19 NOTE — Assessment & Plan Note (Signed)
Still with borderline elevated BP in range pre-HTN 135-139 - improved on re-check - Home BP readings improved to stable No known complications / Hypothyroidism difficult to control on med - now seems controlled  Plan:  1. No treatment today - continue with goal for wt loss first 2. Encourage improved lifestyle - low sodium diet, regular exercise 3. Continue to monitor BP outside office, bring readings to next visit, if persistently >140/90 or new symptoms notify office sooner 4. Follow-up 6 months for lab / wt check  Offered information to [facility] office for weight management program through their office, she is not interested in [facility]

## 2017-12-19 NOTE — Patient Instructions (Addendum)
Thank you for coming to the office today.  Weight Management Clinic - let me know if you need a referral  [facility] [address] [place], [place] [address] Hours: 8am - 5pm Main: [phone]  Continue Levothyroxine 137  A1c 5.5, unchanged, no treatment needed  BP is relatively stable, continue to check BP outside office  DUE for FASTING BLOOD WORK (no food or drink after midnight before the lab appointment, only water or coffee without cream/sugar on the morning of)  SCHEDULE "Lab Only" visit in the morning at the clinic for lab draw in 6 MONTHS   - Make sure Lab Only appointment is at about 1 week before your next appointment, so that results will be available  For Lab Results, once available within 2-3 days of blood draw, you can can log in to MyChart online to view your results and a brief explanation. Also, we can discuss results at next follow-up visit.   Please schedule a Follow-up Appointment to: Return in about 6 months (around 06/20/2018) for Thyroid, Elevated A1c - lab review.  If you have any other questions or concerns, please feel free to call the office or send a message through MyChart. You may also schedule an earlier appointment if necessary.  Additionally, you may be receiving a survey about your experience at our office within a few days to 1 week by e-mail or mail. We value your feedback.  Saralyn Pilar, DO [facility], [place]

## 2017-12-19 NOTE — Progress Notes (Signed)
Subjective:    Patient ID: Cindy Mccoy, female    DOB: [birth year], 57 y.o.   MRN: [id number]  Cindy Mccoy is a 57 y.o. female presenting on 12/19/2017 for Hypothyroidism   HPI   FOLLOW-UP HYPOTHYROIDISM - Last visit with me1/2019, forsame problem at her annual physical, see prior notes for background information. - Interval update TSH trend from 1 to 8.5 down to 0.44 with elevated Free T4 at last visit on levothyroxine , she was reduced to daily - Today she has no new concern on thyroid. Her lab review recently showed improved TSH now in normal range and Free T4 also in normal range - She does not feel that her thyroid is "off" but admits in past she has had normal readings but felt it was not controlled - Still admits some insomnia at times - Admits still difficulty with weight but not attributing this to thyroid - Denies any temperature changes, energy changes  ELEVATED BP without HTN / Pre-HTN - Reports she has checked BP occasionally at home seems to be controlled and better at home SBP 120-130 - Never on anti-HTN meds - Usually has higher BP in office  Elevated A1c / OBESITY BMI >36 - Last visit with me 08/2017, see background information - Interval update A1c trend stable at 5.5. She considered Dr Dalbert Garnet weight management clinic and learned more from their informational but states now that she thinks they have "too many rules" and doesn't think it is the right fit for her - She has had 2 lb wt loss in few months - She would like to consider other options, she is interested in GYN office that offers weight management - She has not resume regular exercise yet but knows she needs to - Fam history of difficulty losing weight, with obesity in family  Additional update - GERD - Reduced Omeprazole from daily to every other day  Depression screen [facility] 2/9 12/19/2017 09/12/2017 06/06/2017  Decreased Interest 0 0 0  Down, Depressed, Hopeless 0 0 0  PHQ - 2  Score 0 0 0    Social History   Tobacco Use  . Smoking status: Former Games developer  . Smokeless tobacco: Never Used  . Tobacco comment: 29 pack per year history, quit several years ago  Substance Use Topics  . Alcohol use: No    Comment: 1-2 drinks per month  . Drug use: No    Comment: Remote marijuana in highschool- no injections    Review of Systems Per HPI unless specifically indicated above     Objective:    BP 134/82 (BP Location: Left Arm, Cuff Size: Normal)   Pulse 62   Temp 98 F (36.7 C) (Oral)   Resp 16   Ht  (1.702 m)   Wt 231 lb (104.8 kg)   LMP 09/23/2012   BMI 36.18 kg/m   Wt Readings from Last 3 Encounters:  12/19/17 231 lb (104.8 kg)  09/12/17 232 lb (105.2 kg)  06/06/17 232 lb 9.6 oz (105.5 kg)    Physical Exam  Constitutional: She is oriented to person, place, and time. She appears well-developed and well-nourished. No distress.  Well-appearing, comfortable, cooperative, obese  HENT:  Head: Normocephalic and atraumatic.  Mouth/Throat: Oropharynx is clear and moist.  Eyes: Conjunctivae are normal. Right eye exhibits no discharge. Left eye exhibits no discharge.  Neck: Normal range of motion. No thyromegaly present.  Cardiovascular: Normal rate and intact distal pulses.  Pulmonary/Chest: Effort normal. No respiratory distress.  Musculoskeletal: She exhibits no edema.  Neurological: She is alert and oriented to person, place, and time.  Skin: Skin is warm and dry. No rash noted. She is not diaphoretic. No erythema.  Psychiatric: She has a normal mood and affect. Her behavior is normal.  Well groomed, good eye contact, normal speech and thoughts  Nursing note and vitals reviewed.  Results for orders placed or performed in visit on 12/12/17  Hemoglobin A1c  Result Value Ref Range   Hgb A1c MFr Bld 5.5 <5.7 % of total Hgb   Mean Plasma Glucose 111 (calc)   eAG (mmol/L) 6.2 (calc)  T4, free  Result Value Ref Range   Free T4 1.8 0.8 - 1.8 ng/dL    TSH  Result Value Ref Range   TSH 0.57 0.40 - 4.50 mIU/L      Assessment & Plan:   Problem List Items Addressed This Visit    GERD (gastroesophageal reflux disease)    Reduced PPI to every other day, remains controlled      Hypothyroidism - Primary    Normalized thyroid hormone levels TSH / Free T4 now on lower Levothyroxine 137 from 150 previously - TSH trend 6 > 1.12 > 8.55 > 0.44 > 0.57 (11/2017)   Plan: 1. CONTINUE Levothyroxine daily 2. Repeat TSH and Free T4 again in 6 months  3. Follow-up 6 months / future consider [facility] for further discussion on thyroid management if sub-optimal control still      Relevant Orders   T4, free   TSH   Obesity (BMI 35.0-39.9 without comorbidity)    Without any significant wt loss (down 2 lbs) Poor lifestyle still non adherence to exercise / diet, not successful in past On lower dose levothyroxine now controlled thyroid Encouraged improve diet/exercise review strategies  Offered referral to [facility] GYN office for further weight management, as she declined Dr Dalbert Garnet [facility]      Pre-hypertension    Still with borderline elevated BP in range pre-HTN 135-139 - improved on re-check - Home BP readings improved to stable No known complications / Hypothyroidism difficult to control on med - now seems controlled  Plan:  1. No treatment today - continue with goal for wt loss first 2. Encourage improved lifestyle - low sodium diet, regular exercise 3. Continue to monitor BP outside office, bring readings to next visit, if persistently >140/90 or new symptoms notify office sooner 4. Follow-up 6 months for lab / wt check  Offered information to [facility] GYN office for weight management program through their office, she is not interested in [facility]      Prediabetes    Well-controlled Pre-DM with A1c 5.5, stable without change, >1-2 years now Concern with obesity, Pre-HTN,  HLD  Plan:  1. Not on any therapy currently - remain off 2. Encourage improved lifestyle - low carb, low sugar diet, reduce portion size, start regular exercise 3. Follow-up 6 months thyroid / monitor A1c - by next year 2020 if A1c still stable can check only q 6 month      Relevant Orders   Hemoglobin A1c      No orders of the defined types were placed in this encounter.   Follow up plan: Return in about 6 months (around 06/20/2018) for Thyroid, Elevated A1c - lab review.  Future labs ordered for 05/2018  Saralyn Pilar, DO [facility] 12/19/2017, 8:41 AM

## 2017-12-19 NOTE — Assessment & Plan Note (Signed)
Normalized thyroid hormone levels TSH / Free T4 now on lower Levothyroxine 137 from 150 previously - TSH trend 6 > 1.12 > 8.55 > 0.44 > 0.57 (11/2017)   Plan: 1. CONTINUE Levothyroxine daily 2. Repeat TSH and Free T4 again in 6 months  3. Follow-up 6 months / future consider [facility] Endocrinology for further discussion on thyroid management if sub-optimal control still

## 2017-12-19 NOTE — Assessment & Plan Note (Signed)
Without any significant wt loss (down 2 lbs) Poor lifestyle still non adherence to exercise / diet, not successful in past On lower dose levothyroxine now controlled thyroid Encouraged improve diet/exercise review strategies  Offered referral to [facility] GYN office for further weight management, as she declined Dr Dalbert Garn[facility]

## 2017-12-19 NOTE — Assessment & Plan Note (Signed)
Reduced PPI to every other day, remains controlled

## 2018-06-15 ENCOUNTER — Other Ambulatory Visit: Payer: BLUE CROSS/BLUE SHIELD

## 2018-06-15 DIAGNOSIS — R7303 Prediabetes: Secondary | ICD-10-CM

## 2018-06-15 DIAGNOSIS — E034 Atrophy of thyroid (acquired): Secondary | ICD-10-CM

## 2018-06-16 LAB — T4, FREE: Free T4: 1.5 ng/dL (ref 0.8–1.8)

## 2018-06-16 LAB — TSH: TSH: 0.68 mIU/L (ref 0.40–4.50)

## 2018-06-16 LAB — HEMOGLOBIN A1C
Hgb A1c MFr Bld: 5.7 % of total Hgb — ABNORMAL HIGH (ref <5.7)
Mean Plasma Glucose: 117 (calc)
eAG (mmol/L): 6.5 (calc)

## 2018-06-20 ENCOUNTER — Other Ambulatory Visit: Payer: Self-pay | Admitting: Family Medicine

## 2018-06-20 ENCOUNTER — Ambulatory Visit (INDEPENDENT_AMBULATORY_CARE_PROVIDER_SITE_OTHER): Payer: BLUE CROSS/BLUE SHIELD | Admitting: Family Medicine

## 2018-06-20 ENCOUNTER — Encounter: Payer: Self-pay | Admitting: Family Medicine

## 2018-06-20 VITALS — BP 138/76 | HR 64 | Temp 97.7°F | Resp 16 | Ht 67.0 in | Wt 228.0 lb

## 2018-06-20 DIAGNOSIS — R7303 Prediabetes: Secondary | ICD-10-CM | POA: Diagnosis not present

## 2018-06-20 DIAGNOSIS — E034 Atrophy of thyroid (acquired): Secondary | ICD-10-CM | POA: Diagnosis not present

## 2018-06-20 DIAGNOSIS — E7800 Pure hypercholesterolemia, unspecified: Secondary | ICD-10-CM

## 2018-06-20 DIAGNOSIS — Z0000 Encounter for general adult medical examination without abnormal findings: Secondary | ICD-10-CM

## 2018-06-20 DIAGNOSIS — R030 Elevated blood-pressure reading, without diagnosis of hypertension: Secondary | ICD-10-CM

## 2018-06-20 DIAGNOSIS — E669 Obesity, unspecified: Secondary | ICD-10-CM

## 2018-06-20 NOTE — Assessment & Plan Note (Signed)
Stable, controlled hypothyroidism Now TSH / Free T4 trend normalized  Plan: 1. CONTINUE Levothyroxine daily 2. Follow-up 4 months w/ annual and labs TSH and Free T4

## 2018-06-20 NOTE — Patient Instructions (Addendum)
Thank you for coming to the office today.  1. Hemoglobin A1c (PreDiabetes) - 5.7, slightly elevated compared to prior results 5.5 to 5.6, in range of Pre-Diabetes (>5.7 to 6.4)   2. TSH Thyroid Function Tests - Normal. Continue current dose Levothyroxine daily  DUE for FASTING BLOOD WORK (no food or drink after midnight before the lab appointment, only water or coffee without cream/sugar on the morning of)  SCHEDULE "Lab Only" visit in the morning at the clinic for lab draw in 4 MONTHS   - Make sure Lab Only appointment is at about 1 week before your next appointment, so that results will be available  For Lab Results, once available within 2-3 days of blood draw, you can can log in to MyChart online to view your results and a brief explanation. Also, we can discuss results at next follow-up visit.   Please schedule a Follow-up Appointment to: Return in about 4 months (around 10/20/2018) for Annual Physical.  If you have any other questions or concerns, please feel free to call the office or send a message through MyChart. You may also schedule an earlier appointment if necessary.  Additionally, you may be receiving a survey about your experience at our office within a few days to 1 week by e-mail or mail. We value your feedback.  Saralyn Pilar, DO [facility], [place]

## 2018-06-20 NOTE — Assessment & Plan Note (Signed)
Slightly increased A1c up to 5.7, prior 5.5 - attributed to some recent stress and poor lifestyle Concern with obesity, Pre-HTN, HLD  Plan:  1. Not on any therapy currently - remain off 2. Encourage improved lifestyle - low carb, low sugar diet, reduce portion size, again try to start regular exercise - Seek weight management program as discussed before if interested 3. Follow-up 4 months w/ labs annual

## 2018-06-20 NOTE — Progress Notes (Signed)
Subjective:    Patient ID: Cindy Mccoy, female    DOB: [birth year], 57 y.o.   MRN: [id number]  Cindy Mccoy is a 57 y.o. female presenting on 06/20/2018 for Thyroid Problem (Hypothyroidism) and Hyperglycemia   HPI   FOLLOW-UP HYPOTHYROIDISM - Last visit with me 11/2017, hypothyroidism, see prior notes for background information. - Interval update TSH trend with stable slightly lower TSH ranging 0.4 to 0.5. And stable Free T4. Now results show TSH 0.68 and Free T4 now 1.5, which are improved and normal. - Today she feels well with regards to thyroid, and feels like current dose Levothyroxine is working well and controlling - Admits still difficulty with weight but not attributing this to thyroid - Denies any temperature changes, energy changes, palpitations, edema  Elevated A1c /OBESITY BMI >35 - Last visit with me 11/2017, see background information - Interval update A1c 5.7 up from prior 5.5 to 5.6 readings - She has not pursued wt loss management program, she has had wt loss 3 lbs in 6 months - Admits some poor diet recently inc sweets and comfort foods, admits family life stressors - Denies hypoglycemia, polyuria, polydipsia  Health Maintenance: UTD Flu Vaccine already  Depression screen [facility] 2/9 06/20/2018 12/19/2017 09/12/2017  Decreased Interest 0 0 0  Down, Depressed, Hopeless 0 0 0  PHQ - 2 Score 0 0 0    Social History   Tobacco Use  . Smoking status: Former Games developer  . Smokeless tobacco: Never Used  . Tobacco comment: 29 pack per year history, quit several years ago  Substance Use Topics  . Alcohol use: No    Comment: 1-2 drinks per month  . Drug use: No    Comment: Remote marijuana in highschool- no injections    Review of Systems Per HPI unless specifically indicated above     Objective:    BP 138/76 (BP Location: Left Arm, Cuff Size: Normal)   Pulse 64   Temp 97.7 F (36.5 C) (Oral)   Resp 16   Ht 5\' 7"  (1.702 m)   Wt 228 lb (103.4 kg)    LMP 09/23/2012   BMI 35.71 kg/m   Wt Readings from Last 3 Encounters:  06/20/18 228 lb (103.4 kg)  12/19/17 231 lb (104.8 kg)  09/12/17 232 lb (105.2 kg)    Physical Exam  Constitutional: She is oriented to person, place, and time. She appears well-developed and well-nourished. No distress.  Well-appearing, comfortable, cooperative, obese  HENT:  Head: Normocephalic and atraumatic.  Mouth/Throat: Oropharynx is clear and moist.  Eyes: Conjunctivae are normal. Right eye exhibits no discharge. Left eye exhibits no discharge.  Cardiovascular: Normal rate.  Pulmonary/Chest: Effort normal.  Musculoskeletal: She exhibits no edema.  Neurological: She is alert and oriented to person, place, and time.  Skin: Skin is warm and dry. No rash noted. She is not diaphoretic. No erythema.  Psychiatric: She has a normal mood and affect. Her behavior is normal.  Well groomed, good eye contact, normal speech and thoughts  Nursing note and vitals reviewed.  Results for orders placed or performed in visit on 06/15/18  TSH  Result Value Ref Range   TSH 0.68 0.40 - 4.50 mIU/L  T4, free  Result Value Ref Range   Free T4 1.5 0.8 - 1.8 ng/dL  Hemoglobin W2N  Result Value Ref Range   Hgb A1c MFr Bld 5.7 (H) <5.7 % of total Hgb   Mean Plasma Glucose 117 (calc)   eAG (mmol/L) 6.5 (calc)  Assessment & Plan:   Problem List Items Addressed This Visit    Hypothyroidism    Stable, controlled hypothyroidism Now TSH / Free T4 trend normalized  Plan: 1. CONTINUE Levothyroxine daily 2. Follow-up 4 months w/ annual and labs TSH and Free T4      Prediabetes - Primary    Slightly increased A1c up to 5.7, prior 5.5 - attributed to some recent stress and poor lifestyle Concern with obesity, Pre-HTN, HLD  Plan:  1. Not on any therapy currently - remain off 2. Encourage improved lifestyle - low carb, low sugar diet, reduce portion size, again try to start regular exercise - Seek weight  management program as discussed before if interested 3. Follow-up 4 months w/ labs annual         No orders of the defined types were placed in this encounter.   Follow up plan: Return in about 4 months (around 10/20/2018) for Annual Physical.  Future labs ordered for 10/20/18  Saralyn Pilar, DO [facility] 06/20/2018, 8:38 AM

## 2018-06-28 ENCOUNTER — Other Ambulatory Visit: Payer: Self-pay | Admitting: Family Medicine

## 2018-06-28 DIAGNOSIS — E034 Atrophy of thyroid (acquired): Secondary | ICD-10-CM

## 2018-06-28 DIAGNOSIS — K219 Gastro-esophageal reflux disease without esophagitis: Secondary | ICD-10-CM

## 2018-09-12 ENCOUNTER — Other Ambulatory Visit: Payer: Self-pay | Admitting: Family Medicine

## 2018-09-12 DIAGNOSIS — Z1231 Encounter for screening mammogram for malignant neoplasm of breast: Secondary | ICD-10-CM

## 2018-10-11 ENCOUNTER — Other Ambulatory Visit: Payer: Self-pay

## 2018-10-11 ENCOUNTER — Encounter: Payer: Self-pay | Admitting: Family Medicine

## 2018-10-11 ENCOUNTER — Ambulatory Visit (INDEPENDENT_AMBULATORY_CARE_PROVIDER_SITE_OTHER): Payer: BLUE CROSS/BLUE SHIELD | Admitting: Family Medicine

## 2018-10-11 VITALS — BP 130/74 | HR 64 | Temp 97.9°F | Resp 16 | Ht 67.0 in | Wt 235.6 lb

## 2018-10-11 DIAGNOSIS — R030 Elevated blood-pressure reading, without diagnosis of hypertension: Secondary | ICD-10-CM | POA: Diagnosis not present

## 2018-10-11 DIAGNOSIS — E669 Obesity, unspecified: Secondary | ICD-10-CM

## 2018-10-11 DIAGNOSIS — Z0000 Encounter for general adult medical examination without abnormal findings: Secondary | ICD-10-CM

## 2018-10-11 DIAGNOSIS — E034 Atrophy of thyroid (acquired): Secondary | ICD-10-CM | POA: Diagnosis not present

## 2018-10-11 DIAGNOSIS — R7303 Prediabetes: Secondary | ICD-10-CM

## 2018-10-11 DIAGNOSIS — E7800 Pure hypercholesterolemia, unspecified: Secondary | ICD-10-CM

## 2018-10-11 NOTE — Patient Instructions (Addendum)
Thank you for coming to the office today.  Labs today, stay tuned for results, will call.  If abnormal Thyroid we can contact you and discuss refer to Endocrinology.  Try to improve lifestyle diet / exercise.  Reconsider weight programs as discussed.  DUE for FASTING BLOOD WORK (no food or drink after midnight before the lab appointment, only water or coffee without cream/sugar on the morning of)  SCHEDULE "Lab Only" visit in the morning at the clinic for lab draw in 6 MONTHS   - Make sure Lab Only appointment is at about 1 week before your next appointment, so that results will be available  For Lab Results, once available within 2-3 days of blood draw, you can can log in to MyChart online to view your results and a brief explanation. Also, we can discuss results at next follow-up visit.   Please schedule a Follow-up Appointment to: Return in about 6 months (around 04/11/2019) for Follow-up 6 month thyroid lab follow-up, BP check.  If you have any other questions or concerns, please feel free to call the office or send a message through MyChart. You may also schedule an earlier appointment if necessary.  Additionally, you may be receiving a survey about your experience at our office within a few days to 1 week by e-mail or mail. We value your feedback.  Saralyn Pilar, DO [facility], [place]

## 2018-10-11 NOTE — Progress Notes (Signed)
Subjective:    Patient ID: Cindy Mccoy, female    DOB: 08/05/1961, 58 y.o.   MRN: [id number]  Cindy RockerBarbara Goon is a 58 y.o. female presenting on 10/11/2018 for Annual Exam   HPI   Here for Annual Physical and Due for fasting lab results.  FOLLOW-UP HYPOTHYROIDISM - Last visit with me 05/2018, hypothyroidism labs TSH and Free T4 remained normal, see prior notes for background information. - Today she feels like thyroid may be off, concerns with weight gain 8 lbs in 3-4 months - Current dose Levothyroxine 137mcg is working well and controlling  Elevated A1c /OBESITY BMI >36 Last A1c 5.7, due today for labs - She has not pursued wt loss management program - due to financial cost limitations and not interested in some as well - Admits some poor diet recently still but trying to improve. Limited exercise - Weight gain - Denies hypoglycemia  Pre-Hypertension Reports elevated BP at times in office and dentist. Now improved here in office on re-check. She is not checking home BP regularly Current Meds - none      Health Maintenance: Due for pap smear - she request to refer to GYN, she will notify name of previous GYN provider  Scheduled for mammogram already 10/18/18  [facility] 10/11/2018 06/20/2018 12/19/2017  Decreased Interest 0 0 0  Down, Depressed, Hopeless 0 0 0  PHQ - 2 Score 0 0 0    Past Medical History:  Diagnosis Date  . Anxiety   . Breast mass, left   . Collapsed lung 2005  . Dermatitis    contact  . Family history of malignant neoplasm of breast   . History of chicken pox   . Pure hypercholesterolemia   . Unspecified menopausal and postmenopausal disorder    Past Surgical History:  Procedure Laterality Date  . BREAST BIOPSY  1981  . BREAST BIOPSY Left    Social History   Socioeconomic History  . Marital status: Married    Spouse name: Not on file  . Number of children: Not on file  . Years of education: Not on file  . Highest  education level: Not on file  Occupational History  . Occupation: CNA  Social Needs  . Financial resource strain: Not on file  . Food insecurity:    Worry: Not on file    Inability: Not on file  . Transportation needs:    Medical: Not on file    Non-medical: Not on file  Tobacco Use  . Smoking status: Former Games developermoker  . Smokeless tobacco: Former NeurosurgeonUser  . Tobacco comment: 29 pack per year history, quit several years ago  Substance and Sexual Activity  . Alcohol use: No    Comment: 1-2 drinks per month  . Drug use: No    Comment: Remote marijuana in highschool- no injections  . Sexual activity: Not on file  Lifestyle  . Physical activity:    Days per week: Not on file    Minutes per session: Not on file  . Stress: Not on file  Relationships  . Social connections:    Talks on phone: Not on file    Gets together: Not on file    Attends religious service: Not on file    Active member of club or organization: Not on file    Attends meetings of clubs or organizations: Not on file    Relationship status: Not on file  . Intimate partner violence:    Fear of current  or ex partner: Not on file    Emotionally abused: Not on file    Physically abused: Not on file    Forced sexual activity: Not on file  Other Topics Concern  . Not on file  Social History Narrative  . Not on file   Family History  Problem Relation Age of Onset  . Cancer Mother        breast  . COPD Mother   . Heart disease Father        CHF   Current Outpatient Medications on File Prior to Visit  Medication Sig  . levothyroxine (SYNTHROID, LEVOTHROID) 137 MCG tablet TAKE 1 TABLET BY MOUTH DAILY BEFORE BREAKFAST  . Multiple Vitamin (MULTIVITAMIN) tablet Take 1 tablet by mouth daily.  Marland Kitchen. omeprazole (PRILOSEC) 20 MG capsule TAKE ONE CAPSULE BY MOUTH ONCE DAILY   No current facility-administered medications on file prior to visit.     Review of Systems  Constitutional: Negative for activity change, appetite  change, chills, diaphoresis, fatigue and fever.  HENT: Negative for congestion and hearing loss.   Eyes: Negative for visual disturbance.  Respiratory: Negative for apnea, cough, choking, chest tightness, shortness of breath and wheezing.   Cardiovascular: Negative for chest pain, palpitations and leg swelling.  Gastrointestinal: Negative for abdominal pain, constipation, diarrhea, nausea and vomiting.  Endocrine: Negative for cold intolerance.  Genitourinary: Negative for difficulty urinating, dysuria, frequency and hematuria.  Musculoskeletal: Negative for arthralgias, back pain and neck pain.  Skin: Negative for rash.  Allergic/Immunologic: Negative for environmental allergies.  Neurological: Negative for dizziness, weakness, light-headedness, numbness and headaches.  Hematological: Negative for adenopathy.  Psychiatric/Behavioral: Negative for behavioral problems, dysphoric mood and sleep disturbance. The patient is not nervous/anxious.    Per HPI unless specifically indicated above     Objective:    BP 130/74 (BP Location: Left Arm, Cuff Size: Normal)   Pulse 64   Temp 97.9 F (36.6 C) (Oral)   Resp 16   Ht 5\' 7"  (1.702 m)   Wt 235 lb 9.6 oz (106.9 kg)   LMP 09/23/2012   BMI 36.90 kg/m   Wt Readings from Last 3 Encounters:  10/11/18 235 lb 9.6 oz (106.9 kg)  06/20/18 228 lb (103.4 kg)  12/19/17 231 lb (104.8 kg)    Physical Exam Vitals signs and nursing note reviewed.  Constitutional:      General: She is not in acute distress.    Appearance: She is well-developed. She is not diaphoretic.     Comments: Well-appearing, comfortable, cooperative, obese  HENT:     Head: Normocephalic and atraumatic.     Comments: Frontal / maxillary sinuses non-tender. Nares patent without purulence or edema. Bilateral TMs clear without erythema, effusion or bulging. Oropharynx clear without erythema, exudates, edema or asymmetry. Eyes:     General:        Right eye: No discharge.         Left eye: No discharge.     Conjunctiva/sclera: Conjunctivae normal.     Pupils: Pupils are equal, round, and reactive to light.  Neck:     Musculoskeletal: Normal range of motion and neck supple.     Thyroid: No thyromegaly.  Cardiovascular:     Rate and Rhythm: Normal rate and regular rhythm.     Heart sounds: Normal heart sounds. No murmur.  Pulmonary:     Effort: Pulmonary effort is normal. No respiratory distress.     Breath sounds: Normal breath sounds. No wheezing or rales.  Abdominal:     General: Bowel sounds are normal. There is no distension.     Palpations: Abdomen is soft. There is no mass.     Tenderness: There is no abdominal tenderness.  Musculoskeletal: Normal range of motion.        General: No tenderness.     Comments: Upper / Lower Extremities: - Normal muscle tone, strength bilateral upper extremities 5/5, lower extremities 5/5  Lymphadenopathy:     Cervical: No cervical adenopathy.  Skin:    General: Skin is warm and dry.     Findings: No erythema or rash.  Neurological:     Mental Status: She is alert and oriented to person, place, and time.     Comments: Distal sensation intact to light touch all extremities  Psychiatric:        Behavior: Behavior normal.     Comments: Well groomed, good eye contact, normal speech and thoughts    Results for orders placed or performed in visit on 06/15/18  TSH  Result Value Ref Range   TSH 0.68 0.40 - 4.50 mIU/L  T4, free  Result Value Ref Range   Free T4 1.5 0.8 - 1.8 ng/dL  Hemoglobin N4B  Result Value Ref Range   Hgb A1c MFr Bld 5.7 (H) <5.7 % of total Hgb   Mean Plasma Glucose 117 (calc)   eAG (mmol/L) 6.5 (calc)      Assessment & Plan:   Problem List Items Addressed This Visit    Hypothyroidism    Previously stable Due for labs today  Plan: Check TSH Free T4 For now CONTINUE Levothyroxine daily Follow-up q 6 mo thyroid panel  If significantly abnormal thyroid results will offer again to  refer to Endocrinology for 2nd opinion      Obesity (BMI 35.0-39.9 without comorbidity)    Persistent abnormal wt gain Encourage to continue improving lifestyle intervention  Recommend reconsider weight management programs / evaluation, she has declined [facility]      Pre-hypertension    Still with borderline elevated BP in range pre-HTN 135-139 - improved on re-check - Home BP readings limited today No known complications / Hypothyroidism difficult to control on med - now seems controlled  Plan:  1. No treatment today - continue with goal for wt loss first 2. Encourage improved lifestyle - low sodium diet, regular exercise 3. Continue to monitor BP outside office, bring readings to next visit, if persistently >140/90 or new symptoms notify office sooner 4. Follow-up 6 months  Offered information to [facility] GYN office for weight management program through their office, she is not interested in [facility]      Prediabetes    Due for A1c, last elevated 5.7 Concern with obesity, Pre-HTN, HLD  Plan:  1. Not on any therapy currently 2. Encourage improved lifestyle - low carb, low sugar diet, reduce portion size, again try to start regular exercise - Seek weight management program as discussed before if interested 3. Follow-up 6 mo A1c      PURE HYPERCHOLESTEROLEMIA    Other Visit Diagnoses    Annual physical exam    -  Primary      Updated Health Maintenance information Labs ordered today fasting, follow-up results Encouraged improvement to lifestyle with diet and exercise - Goal of weight loss   No orders of the defined types were placed in this encounter.   Follow up plan: Return in about 6 months (around 04/11/2019) for  Follow-up 6 month thyroid lab follow-up, BP check.   Future labs to be ordered after review current labs - for 6 months thyroid labs A1c  Saralyn Pilar, DO [facility] 10/11/2018, 9:07 AM

## 2018-10-11 NOTE — Assessment & Plan Note (Signed)
Previously stable Due for labs today  Plan: Check TSH Free T4 For now CONTINUE Levothyroxine daily Follow-up q 6 mo thyroid panel  If significantly abnormal thyroid results will offer again to refer to Endocrinology for 2nd opinion

## 2018-10-11 NOTE — Assessment & Plan Note (Signed)
Due for A1c, last elevated 5.7 Concern with obesity, Pre-HTN, HLD  Plan:  1. Not on any therapy currently 2. Encourage improved lifestyle - low carb, low sugar diet, reduce portion size, again try to start regular exercise - Seek weight management program as discussed before if interested 3. Follow-up 6 mo A1c

## 2018-10-11 NOTE — Assessment & Plan Note (Signed)
Persistent abnormal wt gain Encourage to continue improving lifestyle intervention  Recommend reconsider weight management programs / evaluation, she has declined [facility]

## 2018-10-11 NOTE — Assessment & Plan Note (Signed)
Still with borderline elevated BP in range pre-HTN 135-139 - improved on re-check - Home BP readings limited today No known complications / Hypothyroidism difficult to control on med - now seems controlled  Plan:  1. No treatment today - continue with goal for wt loss first 2. Encourage improved lifestyle - low sodium diet, regular exercise 3. Continue to monitor BP outside office, bring readings to next visit, if persistently >140/90 or new symptoms notify office sooner 4. Follow-up 6 months  Offered information to [facility] GYN office for weight management program through their office, she is not interested in [facility]

## 2018-10-12 LAB — CBC WITH DIFFERENTIAL/PLATELET
Absolute Monocytes: 663 cells/uL (ref 200–950)
Basophils Absolute: 62 cells/uL (ref 0–200)
Basophils Relative: 1 %
Eosinophils Absolute: 279 cells/uL (ref 15–500)
Eosinophils Relative: 4.5 %
HCT: 42.4 % (ref 35.0–45.0)
Hemoglobin: 14.2 g/dL (ref 11.7–15.5)
Lymphs Abs: 1916 cells/uL (ref 850–3900)
MCH: 28.6 pg (ref 27.0–33.0)
MCHC: 33.5 g/dL (ref 32.0–36.0)
MCV: 85.3 fL (ref 80.0–100.0)
MPV: 11.2 fL (ref 7.5–12.5)
Monocytes Relative: 10.7 %
Neutro Abs: 3280 cells/uL (ref 1500–7800)
Neutrophils Relative %: 52.9 %
Platelets: 254 10*3/uL (ref 140–400)
RBC: 4.97 10*6/uL (ref 3.80–5.10)
RDW: 13.4 % (ref 11.0–15.0)
Total Lymphocyte: 30.9 %
WBC: 6.2 10*3/uL (ref 3.8–10.8)

## 2018-10-12 LAB — COMPLETE METABOLIC PANEL WITH GFR
AG Ratio: 1.6 (calc) (ref 1.0–2.5)
ALT: 14 U/L (ref 6–29)
AST: 15 U/L (ref 10–35)
Albumin: 4.1 g/dL (ref 3.6–5.1)
Alkaline phosphatase (APISO): 76 U/L (ref 37–153)
BUN: 15 mg/dL (ref 7–25)
CO2: 26 mmol/L (ref 20–32)
Calcium: 9.2 mg/dL (ref 8.6–10.4)
Chloride: 106 mmol/L (ref 98–110)
Creat: 0.85 mg/dL (ref 0.50–1.05)
GFR, Est African American: 88 mL/min/{1.73_m2} (ref >=60)
GFR, Est Non African American: 76 mL/min/{1.73_m2} (ref >=60)
Globulin: 2.6 g/dL (calc) (ref 1.9–3.7)
Glucose, Bld: 102 mg/dL — ABNORMAL HIGH (ref 65–99)
Potassium: 4.7 mmol/L (ref 3.5–5.3)
Sodium: 141 mmol/L (ref 135–146)
Total Bilirubin: 0.5 mg/dL (ref 0.2–1.2)
Total Protein: 6.7 g/dL (ref 6.1–8.1)

## 2018-10-12 LAB — LIPID PANEL
Cholesterol: 174 mg/dL (ref <200)
HDL: 54 mg/dL (ref >=50)
LDL Cholesterol (Calc): 98 mg/dL (calc)
Non-HDL Cholesterol (Calc): 120 mg/dL (calc) (ref <130)
Total CHOL/HDL Ratio: 3.2 (calc) (ref <5.0)
Triglycerides: 128 mg/dL (ref <150)

## 2018-10-12 LAB — HEMOGLOBIN A1C
Hgb A1c MFr Bld: 5.5 % of total Hgb (ref <5.7)
Mean Plasma Glucose: 111 (calc)
eAG (mmol/L): 6.2 (calc)

## 2018-10-12 LAB — T4, FREE: Free T4: 1.6 ng/dL (ref 0.8–1.8)

## 2018-10-12 LAB — TSH: TSH: 1.23 mIU/L (ref 0.40–4.50)

## 2018-10-18 ENCOUNTER — Ambulatory Visit
Admission: RE | Admit: 2018-10-18 | Discharge: 2018-10-18 | Disposition: A | Discharge status: Home / Self Care | Payer: BLUE CROSS/BLUE SHIELD | Source: Ambulatory Visit | Attending: Family Medicine | Admitting: Family Medicine

## 2018-10-18 DIAGNOSIS — Z1231 Encounter for screening mammogram for malignant neoplasm of breast: Secondary | ICD-10-CM

## 2019-05-22 ENCOUNTER — Ambulatory Visit (INDEPENDENT_AMBULATORY_CARE_PROVIDER_SITE_OTHER): Payer: BC Managed Care – PPO | Admitting: Nurse Practitioner

## 2019-05-22 ENCOUNTER — Encounter: Payer: Self-pay | Admitting: Nurse Practitioner

## 2019-05-22 ENCOUNTER — Other Ambulatory Visit: Payer: Self-pay

## 2019-05-22 DIAGNOSIS — R310 Gross hematuria: Secondary | ICD-10-CM | POA: Diagnosis not present

## 2019-05-22 NOTE — Progress Notes (Signed)
Telemedicine Encounter: Disclosed to patient at start of encounter that we will provide appropriate telemedicine services.  Patient consents to be treated via phone prior to discussion. - Patient is at her home and is accessed via telephone. - Services are provided by Wilhelmina Mcardle from [facility].  Subjective:    Patient ID: Cindy Mccoy, female    DOB: Nov 12, 1960, 58 y.o.   MRN: [id number]  Cindy Mccoy is a 58 y.o. female presenting on 05/22/2019 for Urinary Tract Infection (urinary urgency, urinary pressure and gross hematuria x 2 days ago, but subsided after 24 hrs. )  HPI Gross hematuria No sharp pain.  Only increased pressure "like my urethra was stuck" - Like it wanted to get something out, but couldn't.    Social History   Tobacco Use  . Smoking status: Former Games developer  . Smokeless tobacco: Former Neurosurgeon  . Tobacco comment: 29 pack per year history, quit several years ago  Substance Use Topics  . Alcohol use: No    Comment: 1-2 drinks per month  . Drug use: No    Comment: Remote marijuana in highschool- no injections    Review of Systems Per HPI unless specifically indicated above     Objective:    LMP 09/23/2012   Wt Readings from Last 3 Encounters:  10/11/18 235 lb 9.6 oz (106.9 kg)  06/20/18 228 lb (103.4 kg)  12/19/17 231 lb (104.8 kg)    Physical Exam Patient remotely monitored.  Verbal communication appropriate.  Cognition normal.  Results for orders placed or performed in visit on 10/11/18  TSH  Result Value Ref Range   TSH 1.23 0.40 - 4.50 mIU/L  T4, free  Result Value Ref Range   Free T4 1.6 0.8 - 1.8 ng/dL  Lipid panel  Result Value Ref Range   Cholesterol 174 <200 mg/dL   HDL 54 > OR = 50 mg/dL   Triglycerides 836 <629 mg/dL   LDL Cholesterol (Calc) 98 mg/dL (calc)   Total CHOL/HDL Ratio 3.2 <5.0 (calc)   Non-HDL Cholesterol (Calc) 120 <130 mg/dL (calc)  COMPLETE METABOLIC PANEL WITH GFR  Result Value Ref Range   Glucose, Bld 102 (H) 65 - 99 mg/dL   BUN 15 7 - 25 mg/dL   Creat 4.76 5.46 - 5.03 mg/dL   GFR, Est Non African American 76 > OR = 60 mL/min/1.39m2   GFR, Est African American 88 > OR = 60 mL/min/1.82m2   BUN/Creatinine Ratio NOT APPLICABLE 6 - 22 (calc)   Sodium 141 135 - 146 mmol/L   Potassium 4.7 3.5 - 5.3 mmol/L   Chloride 106 98 - 110 mmol/L   CO2 26 20 - 32 mmol/L   Calcium 9.2 8.6 - 10.4 mg/dL   Total Protein 6.7 6.1 - 8.1 g/dL   Albumin 4.1 3.6 - 5.1 g/dL   Globulin 2.6 1.9 - 3.7 g/dL (calc)   AG Ratio 1.6 1.0 - 2.5 (calc)   Total Bilirubin 0.5 0.2 - 1.2 mg/dL   Alkaline phosphatase (APISO) 76 37 - 153 U/L   AST 15 10 - 35 U/L   ALT 14 6 - 29 U/L  CBC with Differential/Platelet  Result Value Ref Range   WBC 6.2 3.8 - 10.8 Thousand/uL   RBC 4.97 3.80 - 5.10 Million/uL   Hemoglobin 14.2 11.7 - 15.5 g/dL   HCT 54.6 56.8 - 12.7 %   MCV 85.3 80.0 - 100.0 fL   MCH 28.6 27.0 - 33.0 pg   MCHC  33.5 32.0 - 36.0 g/dL   RDW 13.4 11.0 - 15.0 %   Platelets 254 140 - 400 Thousand/uL   MPV 11.2 7.5 - 12.5 fL   Neutro Abs 3,280 1,500 - 7,800 cells/uL   Lymphs Abs 1,916 850 - 3,900 cells/uL   Absolute Monocytes 663 200 - 950 cells/uL   Eosinophils Absolute 279 15 - 500 cells/uL   Basophils Absolute 62 0 - 200 cells/uL   Neutrophils Relative % 52.9 %   Total Lymphocyte 30.9 %   Monocytes Relative 10.7 %   Eosinophils Relative 4.5 %   Basophils Relative 1.0 %  Hemoglobin A1c  Result Value Ref Range   Hgb A1c MFr Bld 5.5 <5.7 % of total Hgb   Mean Plasma Glucose 111 (calc)   eAG (mmol/L) 6.2 (calc)      Assessment & Plan:   Problem List Items Addressed This Visit    None    Visit Diagnoses    Gross hematuria    -  Primary   Relevant Orders   Urinalysis, Routine w reflex microscopic     Patient with gross hematuria and dysuria x 2 days ago.  Now resolved fully without recurrence.  Patient without prior gross hematuria.  No pain other than urethral discomfort noted during  episode. Differential Dx: acute cystitis, nephrolithiasis, malignancy  Plan: 1. Ordered urinalysis with microscopy to evaluate for persistent hematuria. 2. Follow-up 5-7 days prn.  - Time spent in direct consultation with patient via telemedicine about above concerns: 5 minutes  Follow up plan: Return if symptoms worsen or fail to improve.  Cassell Smiles, DNP, AGPCNP-BC Adult Gerontology Primary Care Nurse Practitioner [facility] 05/22/2019, 8:05 AM

## 2019-05-23 LAB — URINALYSIS, ROUTINE W REFLEX MICROSCOPIC
Bacteria, UA: NONE SEEN /HPF
Bilirubin Urine: NEGATIVE
Glucose, UA: NEGATIVE
Hgb urine dipstick: NEGATIVE
Ketones, ur: NEGATIVE
Nitrite: NEGATIVE
Protein, ur: NEGATIVE
Specific Gravity, Urine: 1.014 (ref 1.001–1.03)
pH: 5.5 (ref 5.0–8.0)

## 2019-06-18 ENCOUNTER — Other Ambulatory Visit: Payer: Self-pay | Admitting: *Deleted

## 2019-06-18 DIAGNOSIS — Z20822 Contact with and (suspected) exposure to covid-19: Secondary | ICD-10-CM

## 2019-06-20 LAB — NOVEL CORONAVIRUS, NAA: SARS-CoV-2, NAA: NOT DETECTED

## 2019-07-10 ENCOUNTER — Other Ambulatory Visit: Payer: Self-pay | Admitting: Family Medicine

## 2019-07-10 DIAGNOSIS — K219 Gastro-esophageal reflux disease without esophagitis: Secondary | ICD-10-CM

## 2019-08-26 ENCOUNTER — Other Ambulatory Visit: Payer: Self-pay

## 2019-08-26 ENCOUNTER — Ambulatory Visit (LOCAL_COMMUNITY_HEALTH_CENTER): Payer: Self-pay

## 2019-08-26 DIAGNOSIS — Z111 Encounter for screening for respiratory tuberculosis: Secondary | ICD-10-CM

## 2019-08-26 NOTE — Progress Notes (Signed)
Reports recently had covid vaccine Cindy Campbell, RN

## 2019-08-29 ENCOUNTER — Ambulatory Visit (LOCAL_COMMUNITY_HEALTH_CENTER): Payer: Self-pay

## 2019-08-29 ENCOUNTER — Other Ambulatory Visit: Payer: Self-pay

## 2019-08-29 DIAGNOSIS — Z111 Encounter for screening for respiratory tuberculosis: Secondary | ICD-10-CM

## 2019-08-29 LAB — TB SKIN TEST
Induration: 0 mm
TB Skin Test: NEGATIVE

## 2019-09-07 ENCOUNTER — Other Ambulatory Visit: Payer: Self-pay | Admitting: Family Medicine

## 2019-09-07 DIAGNOSIS — E034 Atrophy of thyroid (acquired): Secondary | ICD-10-CM

## 2019-09-24 ENCOUNTER — Other Ambulatory Visit: Payer: Self-pay | Admitting: Family Medicine

## 2019-09-24 DIAGNOSIS — Z1231 Encounter for screening mammogram for malignant neoplasm of breast: Secondary | ICD-10-CM

## 2019-10-25 ENCOUNTER — Telehealth: Payer: Self-pay | Admitting: Family Medicine

## 2019-10-25 DIAGNOSIS — E7800 Pure hypercholesterolemia, unspecified: Secondary | ICD-10-CM

## 2019-10-25 DIAGNOSIS — Z0000 Encounter for general adult medical examination without abnormal findings: Secondary | ICD-10-CM

## 2019-10-25 DIAGNOSIS — E034 Atrophy of thyroid (acquired): Secondary | ICD-10-CM

## 2019-10-25 DIAGNOSIS — R030 Elevated blood-pressure reading, without diagnosis of hypertension: Secondary | ICD-10-CM

## 2019-10-25 DIAGNOSIS — E669 Obesity, unspecified: Secondary | ICD-10-CM

## 2019-10-25 DIAGNOSIS — R7303 Prediabetes: Secondary | ICD-10-CM

## 2019-10-25 NOTE — Telephone Encounter (Signed)
Signed orders  Saralyn Pilar, DO [facility] 10/25/2019, 2:35 PM

## 2019-11-04 ENCOUNTER — Ambulatory Visit: Payer: BLUE CROSS/BLUE SHIELD

## 2019-11-05 ENCOUNTER — Ambulatory Visit: Payer: BLUE CROSS/BLUE SHIELD

## 2019-11-07 ENCOUNTER — Ambulatory Visit
Admission: RE | Admit: 2019-11-07 | Discharge: 2019-11-07 | Disposition: A | Discharge status: Home / Self Care | Payer: BC Managed Care – PPO | Source: Ambulatory Visit | Attending: Family Medicine | Admitting: Family Medicine

## 2019-11-07 ENCOUNTER — Other Ambulatory Visit: Payer: Self-pay

## 2019-11-07 DIAGNOSIS — Z1231 Encounter for screening mammogram for malignant neoplasm of breast: Secondary | ICD-10-CM

## 2019-11-13 ENCOUNTER — Other Ambulatory Visit: Payer: Self-pay

## 2019-11-13 ENCOUNTER — Other Ambulatory Visit: Payer: BC Managed Care – PPO

## 2019-11-13 DIAGNOSIS — Z0000 Encounter for general adult medical examination without abnormal findings: Secondary | ICD-10-CM | POA: Diagnosis not present

## 2019-11-13 DIAGNOSIS — R7303 Prediabetes: Secondary | ICD-10-CM

## 2019-11-13 DIAGNOSIS — E034 Atrophy of thyroid (acquired): Secondary | ICD-10-CM | POA: Diagnosis not present

## 2019-11-13 DIAGNOSIS — E7800 Pure hypercholesterolemia, unspecified: Secondary | ICD-10-CM | POA: Diagnosis not present

## 2019-11-13 DIAGNOSIS — E669 Obesity, unspecified: Secondary | ICD-10-CM

## 2019-11-13 DIAGNOSIS — R030 Elevated blood-pressure reading, without diagnosis of hypertension: Secondary | ICD-10-CM

## 2019-11-14 LAB — CBC WITH DIFFERENTIAL/PLATELET
Absolute Monocytes: 519 cells/uL (ref 200–950)
Basophils Absolute: 51 cells/uL (ref 0–200)
Basophils Relative: 0.9 %
Eosinophils Absolute: 251 cells/uL (ref 15–500)
Eosinophils Relative: 4.4 %
HCT: 43.7 % (ref 35.0–45.0)
Hemoglobin: 14.5 g/dL (ref 11.7–15.5)
Lymphs Abs: 1733 cells/uL (ref 850–3900)
MCH: 29.4 pg (ref 27.0–33.0)
MCHC: 33.2 g/dL (ref 32.0–36.0)
MCV: 88.5 fL (ref 80.0–100.0)
MPV: 11.1 fL (ref 7.5–12.5)
Monocytes Relative: 9.1 %
Neutro Abs: 3146 cells/uL (ref 1500–7800)
Neutrophils Relative %: 55.2 %
Platelets: 218 10*3/uL (ref 140–400)
RBC: 4.94 10*6/uL (ref 3.80–5.10)
RDW: 14.2 % (ref 11.0–15.0)
Total Lymphocyte: 30.4 %
WBC: 5.7 10*3/uL (ref 3.8–10.8)

## 2019-11-14 LAB — COMPLETE METABOLIC PANEL WITH GFR
AG Ratio: 1.6 (calc) (ref 1.0–2.5)
ALT: 15 U/L (ref 6–29)
AST: 14 U/L (ref 10–35)
Albumin: 4 g/dL (ref 3.6–5.1)
Alkaline phosphatase (APISO): 72 U/L (ref 37–153)
BUN: 12 mg/dL (ref 7–25)
CO2: 28 mmol/L (ref 20–32)
Calcium: 8.8 mg/dL (ref 8.6–10.4)
Chloride: 106 mmol/L (ref 98–110)
Creat: 0.73 mg/dL (ref 0.50–1.05)
GFR, Est African American: 105 mL/min/{1.73_m2} (ref >=60)
GFR, Est Non African American: 91 mL/min/{1.73_m2} (ref >=60)
Globulin: 2.5 g/dL (calc) (ref 1.9–3.7)
Glucose, Bld: 94 mg/dL (ref 65–99)
Potassium: 4.1 mmol/L (ref 3.5–5.3)
Sodium: 141 mmol/L (ref 135–146)
Total Bilirubin: 0.5 mg/dL (ref 0.2–1.2)
Total Protein: 6.5 g/dL (ref 6.1–8.1)

## 2019-11-14 LAB — LIPID PANEL
Cholesterol: 182 mg/dL (ref <200)
HDL: 50 mg/dL (ref >=50)
LDL Cholesterol (Calc): 107 mg/dL (calc) — ABNORMAL HIGH
Non-HDL Cholesterol (Calc): 132 mg/dL (calc) — ABNORMAL HIGH (ref <130)
Total CHOL/HDL Ratio: 3.6 (calc) (ref <5.0)
Triglycerides: 131 mg/dL (ref <150)

## 2019-11-14 LAB — HEMOGLOBIN A1C
Hgb A1c MFr Bld: 5.5 % of total Hgb (ref <5.7)
Mean Plasma Glucose: 111 (calc)
eAG (mmol/L): 6.2 (calc)

## 2019-11-14 LAB — T4, FREE: Free T4: 1.6 ng/dL (ref 0.8–1.8)

## 2019-11-14 LAB — TSH: TSH: 0.98 mIU/L (ref 0.40–4.50)

## 2019-11-19 ENCOUNTER — Other Ambulatory Visit: Payer: Self-pay | Admitting: Family Medicine

## 2019-11-19 ENCOUNTER — Encounter: Payer: Self-pay | Admitting: Family Medicine

## 2019-11-19 ENCOUNTER — Ambulatory Visit (INDEPENDENT_AMBULATORY_CARE_PROVIDER_SITE_OTHER): Payer: BC Managed Care – PPO | Admitting: Family Medicine

## 2019-11-19 ENCOUNTER — Other Ambulatory Visit: Payer: Self-pay

## 2019-11-19 VITALS — BP 139/89 | HR 71 | Temp 97.7°F | Resp 16 | Ht 67.0 in | Wt 245.0 lb

## 2019-11-19 DIAGNOSIS — Z0000 Encounter for general adult medical examination without abnormal findings: Secondary | ICD-10-CM

## 2019-11-19 DIAGNOSIS — E034 Atrophy of thyroid (acquired): Secondary | ICD-10-CM | POA: Diagnosis not present

## 2019-11-19 DIAGNOSIS — E7800 Pure hypercholesterolemia, unspecified: Secondary | ICD-10-CM

## 2019-11-19 DIAGNOSIS — E669 Obesity, unspecified: Secondary | ICD-10-CM | POA: Diagnosis not present

## 2019-11-19 DIAGNOSIS — R7303 Prediabetes: Secondary | ICD-10-CM

## 2019-11-19 NOTE — Assessment & Plan Note (Signed)
Improved A1c down to 5.5, prior range 5.5 to 5.7 Concern with obesity, Pre-HTN, HLD  Plan:  1. Not on any therapy currently 2. Encourage improved lifestyle - low carb, low sugar diet, reduce portion size, again try to start regular exercise - Seek weight management program as discussed before if interested 3. Follow-up 6 mo A1c w labs

## 2019-11-19 NOTE — Progress Notes (Signed)
Subjective:    Patient ID: Cindy Mccoy, female    DOB: 1961-03-14, 59 y.o.   MRN: [id number]  Cindy Mccoy is a 59 y.o. female presenting on 11/19/2019 for Annual Exam   HPI  Here for Annual Physical and Due for fasting lab results.  FOLLOW-UP HYPOTHYROIDISM - Last visit withme 09/2018 for physical,hypothyroidism labs TSH and Free T4 remained normal, see prior notes for background information. - Current dose Levothyroxine 127mcg is working well and controlling  Elevated A1c /OBESITY BMI >38 Last lab showed A1c down to 5.5 (10/2019) previously 5.5 to 5.7 Lab showed LDL up to 107, mild increased since last result. - She has not pursued wt loss management program - she is considering this option now that she is vaccinated from [place] some poor diet recently still but trying to improve. Limited exercise - Weight gain with difficulty losing weight up to 10 lbs in 1 month - Denies hypoglycemia  Pre-Hypertension History of elevated BP, but improved now. Current Meds - none       Health Maintenance: Due for pap smear - she request to refer to GYN, she will notify name of previous GYN provider  Mammogram 11/07/19 Up to date.  UTD COVID19 vaccine. Reviewed card.   Depression screen [facility] 2/9 11/19/2019 10/11/2018 06/20/2018  Decreased Interest 0 0 0  Down, Depressed, Hopeless 0 0 0  PHQ - 2 Score 0 0 0    Past Medical History:  Diagnosis Date  . Anxiety   . Breast mass, left   . Collapsed lung 2005  . Dermatitis    contact  . Family history of malignant neoplasm of breast   . History of chicken pox   . Pure hypercholesterolemia   . Unspecified menopausal and postmenopausal disorder    Past Surgical History:  Procedure Laterality Date  . BREAST BIOPSY  1981  . BREAST BIOPSY Left    Social History   Socioeconomic History  . Marital status: Married    Spouse name: Not on file  . Number of children: Not on file  . Years of education: Not on file    . Highest education level: Not on file  Occupational History  . Occupation: CNA  Tobacco Use  . Smoking status: Former Research scientist (life sciences)  . Smokeless tobacco: Former Systems developer  . Tobacco comment: 29 pack per year history, quit several years ago  Substance and Sexual Activity  . Alcohol use: No    Comment: 1-2 drinks per month  . Drug use: No    Comment: Remote marijuana in highschool- no injections  . Sexual activity: Not on file  Other Topics Concern  . Not on file  Social History Narrative  . Not on file   Social Determinants of Health   Financial Resource Strain:   . Difficulty of Paying Living Expenses:   Food Insecurity:   . Worried About Charity fundraiser in the Last Year:   . Arboriculturist in the Last Year:   Transportation Needs:   . Film/video editor (Medical):   Marland Kitchen Lack of Transportation (Non-Medical):   Physical Activity:   . Days of Exercise per Week:   . Minutes of Exercise per Session:   Stress:   . Feeling of Stress :   Social Connections:   . Frequency of Communication with Friends and Family:   . Frequency of Social Gatherings with Friends and Family:   . Attends Religious Services:   . Active Member of Clubs  or Organizations:   . Attends Banker Meetings:   Marland Kitchen Marital Status:   Intimate Partner Violence:   . Fear of Current or Ex-Partner:   . Emotionally Abused:   Marland Kitchen Physically Abused:   . Sexually Abused:    Family History  Problem Relation Age of Onset  . Cancer Mother        breast  . COPD Mother   . Heart disease Father        CHF   Current Outpatient Medications on File Prior to Visit  Medication Sig  . levothyroxine (SYNTHROID) 137 MCG tablet TAKE 1 TABLET BY MOUTH DAILY BEFORE BREAKFAST  . Multiple Vitamin (MULTIVITAMIN) tablet Take 1 tablet by mouth daily.  Marland Kitchen omeprazole (PRILOSEC) 20 MG capsule TAKE ONE CAPSULE BY MOUTH ONCE DAILY   No current facility-administered medications on file prior to visit.    Review of Systems   Constitutional: Negative for activity change, appetite change, chills, diaphoresis, fatigue and fever.  HENT: Negative for congestion and hearing loss.   Eyes: Negative for visual disturbance.  Respiratory: Negative for apnea, cough, chest tightness, shortness of breath and wheezing.   Cardiovascular: Negative for chest pain, palpitations and leg swelling.  Gastrointestinal: Negative for abdominal pain, anal bleeding, blood in stool, constipation, diarrhea, nausea and vomiting.  Endocrine: Negative for cold intolerance.  Genitourinary: Negative for difficulty urinating, dysuria, frequency and hematuria.  Musculoskeletal: Negative for arthralgias, back pain and neck pain.  Skin: Negative for rash.  Allergic/Immunologic: Negative for environmental allergies.  Neurological: Negative for dizziness, weakness, light-headedness, numbness and headaches.  Hematological: Negative for adenopathy.  Psychiatric/Behavioral: Negative for behavioral problems, dysphoric mood and sleep disturbance. The patient is not nervous/anxious.    Per HPI unless specifically indicated above      Objective:    BP 139/89   Pulse 71   Temp 97.7 F (36.5 C) (Temporal)   Resp 16   Ht 5\' 7"  (1.702 m)   Wt 245 lb (111.1 kg)   LMP 09/23/2012   BMI 38.37 kg/m   Wt Readings from Last 3 Encounters:  11/19/19 245 lb (111.1 kg)  10/11/18 235 lb 9.6 oz (106.9 kg)  06/20/18 228 lb (103.4 kg)    Physical Exam Vitals and nursing note reviewed.  Constitutional:      General: She is not in acute distress.    Appearance: She is well-developed. She is obese. She is not diaphoretic.     Comments: Well-appearing, comfortable, cooperative  HENT:     Head: Normocephalic and atraumatic.  Eyes:     General:        Right eye: No discharge.        Left eye: No discharge.     Conjunctiva/sclera: Conjunctivae normal.     Pupils: Pupils are equal, round, and reactive to light.  Neck:     Thyroid: No thyromegaly.      Comments: Normal thyroid on exam Cardiovascular:     Rate and Rhythm: Normal rate and regular rhythm.     Heart sounds: Normal heart sounds. No murmur.  Pulmonary:     Effort: Pulmonary effort is normal. No respiratory distress.     Breath sounds: Normal breath sounds. No wheezing or rales.  Abdominal:     General: Bowel sounds are normal. There is no distension.     Palpations: Abdomen is soft. There is no mass.     Tenderness: There is no abdominal tenderness.  Musculoskeletal:  General: No tenderness. Normal range of motion.     Cervical back: Normal range of motion and neck supple.     Comments: Upper / Lower Extremities: - Normal muscle tone, strength bilateral upper extremities 5/5, lower extremities 5/5  Lymphadenopathy:     Cervical: No cervical adenopathy.  Skin:    General: Skin is warm and dry.     Findings: No erythema or rash.  Neurological:     Mental Status: She is alert and oriented to person, place, and time.     Comments: Distal sensation intact to light touch all extremities  Psychiatric:        Behavior: Behavior normal.     Comments: Well groomed, good eye contact, normal speech and thoughts      CLINICAL DATA:  Screening.  EXAM: DIGITAL SCREENING BILATERAL MAMMOGRAM WITH TOMO AND CAD  COMPARISON:  Previous exam(s).  ACR Breast Density Category b: There are scattered areas of fibroglandular density.  FINDINGS: There are no findings suspicious for malignancy. Images were processed with CAD.  IMPRESSION: No mammographic evidence of malignancy. A result letter of this screening mammogram will be mailed directly to the patient.  RECOMMENDATION: Screening mammogram in one year. (Code:SM-B-01Y)  BI-RADS CATEGORY  1: Negative.   Electronically Signed   By: Emmaline Kluver M.D.   On: 11/08/2019 14:13  Results for orders placed or performed in visit on 11/13/19  T4, free  Result Value Ref Range   Free T4 1.6 0.8 - 1.8 ng/dL    TSH  Result Value Ref Range   TSH 0.98 0.40 - 4.50 mIU/L  COMPLETE METABOLIC PANEL WITH GFR  Result Value Ref Range   Glucose, Bld 94 65 - 99 mg/dL   BUN 12 7 - 25 mg/dL   Creat 7.82 4.23 - 5.36 mg/dL   GFR, Est Non African American 91 > OR = 60 mL/min/1.39m2   GFR, Est African American 105 > OR = 60 mL/min/1.31m2   BUN/Creatinine Ratio NOT APPLICABLE 6 - 22 (calc)   Sodium 141 135 - 146 mmol/L   Potassium 4.1 3.5 - 5.3 mmol/L   Chloride 106 98 - 110 mmol/L   CO2 28 20 - 32 mmol/L   Calcium 8.8 8.6 - 10.4 mg/dL   Total Protein 6.5 6.1 - 8.1 g/dL   Albumin 4.0 3.6 - 5.1 g/dL   Globulin 2.5 1.9 - 3.7 g/dL (calc)   AG Ratio 1.6 1.0 - 2.5 (calc)   Total Bilirubin 0.5 0.2 - 1.2 mg/dL   Alkaline phosphatase (APISO) 72 37 - 153 U/L   AST 14 10 - 35 U/L   ALT 15 6 - 29 U/L  Lipid panel  Result Value Ref Range   Cholesterol 182 <200 mg/dL   HDL 50 > OR = 50 mg/dL   Triglycerides 144 <315 mg/dL   LDL Cholesterol (Calc) 107 (H) mg/dL (calc)   Total CHOL/HDL Ratio 3.6 <5.0 (calc)   Non-HDL Cholesterol (Calc) 132 (H) <130 mg/dL (calc)  Hemoglobin Q0G  Result Value Ref Range   Hgb A1c MFr Bld 5.5 <5.7 % of total Hgb   Mean Plasma Glucose 111 (calc)   eAG (mmol/L) 6.2 (calc)  CBC with Differential/Platelet  Result Value Ref Range   WBC 5.7 3.8 - 10.8 Thousand/uL   RBC 4.94 3.80 - 5.10 Million/uL   Hemoglobin 14.5 11.7 - 15.5 g/dL   HCT 86.7 61.9 - 50.9 %   MCV 88.5 80.0 - 100.0 fL   MCH 29.4 27.0 -  33.0 pg   MCHC 33.2 32.0 - 36.0 g/dL   RDW 52.7 78.2 - 42.3 %   Platelets 218 140 - 400 Thousand/uL   MPV 11.1 7.5 - 12.5 fL   Neutro Abs 3,146 1,500 - 7,800 cells/uL   Lymphs Abs 1,733 850 - 3,900 cells/uL   Absolute Monocytes 519 200 - 950 cells/uL   Eosinophils Absolute 251 15 - 500 cells/uL   Basophils Absolute 51 0 - 200 cells/uL   Neutrophils Relative % 55.2 %   Total Lymphocyte 30.4 %   Monocytes Relative 9.1 %   Eosinophils Relative 4.4 %   Basophils Relative 0.9 %       Assessment & Plan:   Problem List Items Addressed This Visit    PURE HYPERCHOLESTEROLEMIA    Mild elevated LDL still near goal at 107 Last lipid panel 10/2019 Calculated ASCVD 10 yr risk score 2%  Plan: Encourage improved lifestyle - low carb/cholesterol, reduce portion size, start regular exercise      Prediabetes    Improved A1c down to 5.5, prior range 5.5 to 5.7 Concern with obesity, Pre-HTN, HLD  Plan:  1. Not on any therapy currently 2. Encourage improved lifestyle - low carb, low sugar diet, reduce portion size, again try to start regular exercise - Seek weight management program as discussed before if interested 3. Follow-up 6 mo A1c w labs      Obesity (BMI 35.0-39.9 without comorbidity)    Abnormal weight gain, with difficulty losing Goal to improve lifestyle diet exercise Future reconsider weight management strategy - offered again refer to [facility] in [place], she will check into them again and let us know if interested and if need referral. - Future may reconsider medications such as GLP1 and contrave for weight loss benefits, check ins coverage      Hypothyroidism    Stable without complication, still difficulty losing weight Labs TSH Free T4 normal range on med therapy Controlled on current Levothyroxine daily Repeat thyroid panel in 6 months       Other Visit Diagnoses    Annual physical exam    -  Primary      Updated Health Maintenance information Reviewed recent lab results with patient Encouraged improvement to lifestyle with diet and exercise - Goal of weight loss. Can contact [facility] in [place] to see if she will pursue this option and request a referral from [place] if wants to proceed - Gave list of possible weight management   No orders of the defined types were placed in this encounter.   Follow up plan: Return in about 6 months (around 05/21/2020) for 6 month Weight Check /  Thyroid.  Future labs ordered for Thyroid, A1c on 05/22/20  Saralyn Pilar, DO [facility] 11/19/2019, 9:20 AM

## 2019-11-19 NOTE — Assessment & Plan Note (Signed)
Mild elevated LDL still near goal at 107 Last lipid panel 10/2019 Calculated ASCVD 10 yr risk score 2%  Plan: Encourage improved lifestyle - low carb/cholesterol, reduce portion size, start regular exercise

## 2019-11-19 NOTE — Patient Instructions (Addendum)
Thank you for coming to the office today.  Lab results are good. TSH and Thyroid result is normal. Last lab 137 mcg  [facility] [address] [place], [place] [address] Ph: [phone]  Call and check into this option. If you want to proceed - send a mychart message to request referral.  If you want to work on lifestyle and possibly medicines in future.  Weekly injection (diabetic medicine for weight loss) - Ozempic, Trulicity, Bydureon BCise  Oral tablet daily - Rybelsus  Saxenda (is indicated for obesity, NOT required to be diabetic) - DAILY injection  Contrave (appetite suppressant for weight loss - usually higher cost, less covered - daily pill take for up to 3 months)  DUE for FASTING BLOOD WORK (no food or drink after midnight before the lab appointment, only water or coffee without cream/sugar on the morning of)  SCHEDULE "Lab Only" visit in the morning at the clinic for lab draw in 6 MONTHS   - Make sure Lab Only appointment is at about 1 week before your next appointment, so that results will be available  For Lab Results, once available within 2-3 days of blood draw, you can can log in to MyChart online to view your results and a brief explanation. Also, we can discuss results at next follow-up visit.   Please schedule a Follow-up Appointment to: Return in about 6 months (around 05/21/2020) for 6 month Weight Check / Thyroid.  If you have any other questions or concerns, please feel free to call the office or send a message through MyChart. You may also schedule an earlier appointment if necessary.  Additionally, you may be receiving a survey about your experience at our office within a few days to 1 week by e-mail or mail. We value your feedback.  Saralyn Pilar, DO [facility], [place]

## 2019-11-19 NOTE — Assessment & Plan Note (Addendum)
Stable without complication, still difficulty losing weight Labs TSH Free T4 normal range on med therapy Controlled on current Levothyroxine daily Repeat thyroid panel in 6 months

## 2019-11-19 NOTE — Assessment & Plan Note (Signed)
Abnormal weight gain, with difficulty losing Goal to improve lifestyle diet exercise Future reconsider weight management strategy - offered again refer to [facility] in [place], she will check into them again and let us know if interested and if need referral. - Future may reconsider medications such as GLP1 and contrave for weight loss benefits, check ins coverage

## 2020-02-17 IMAGING — MG DIGITAL SCREENING BILATERAL MAMMOGRAM WITH TOMO AND CAD
8 series · 8 of 24 positions shown · non-contrast
Comparison: Previous exam(s).

CLINICAL DATA: Screening.

EXAM:
DIGITAL SCREENING BILATERAL MAMMOGRAM WITH TOMO AND CAD

[R CC synth-2D]
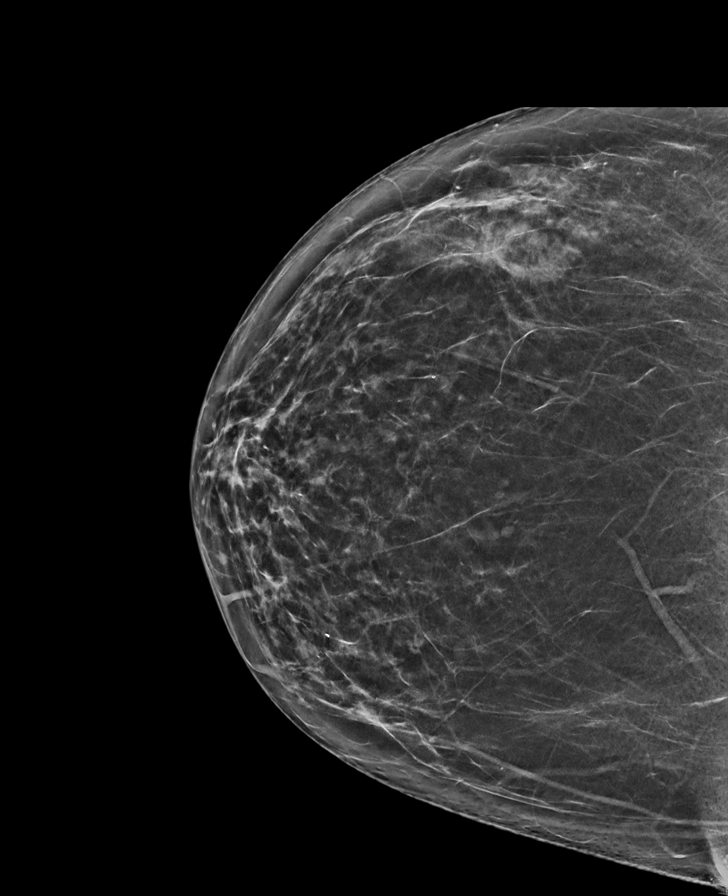

[L MLO synth-2D]
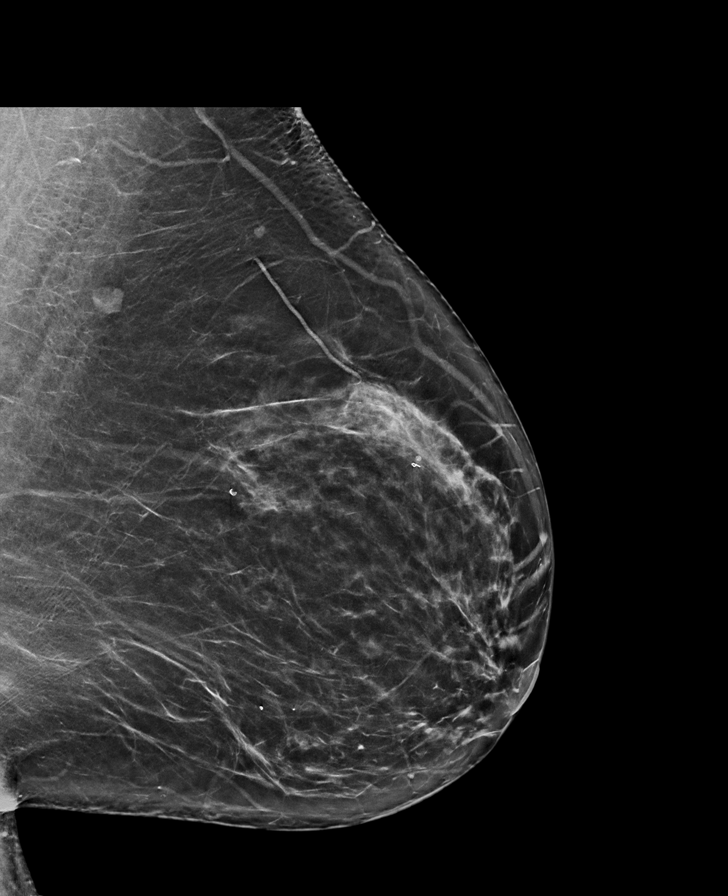

[L CC synth-2D]
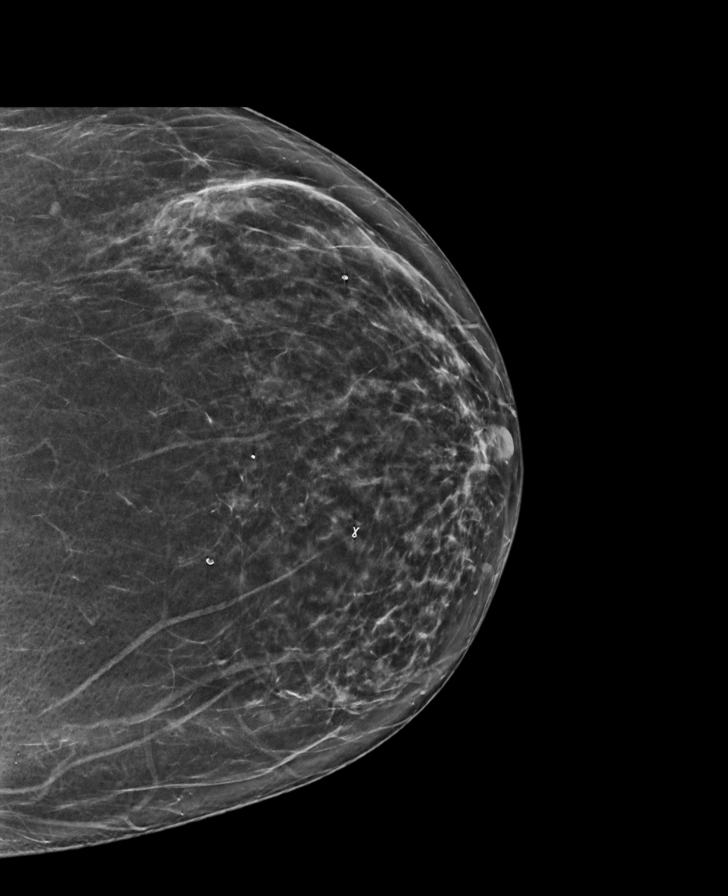

[R MLO synth-2D]
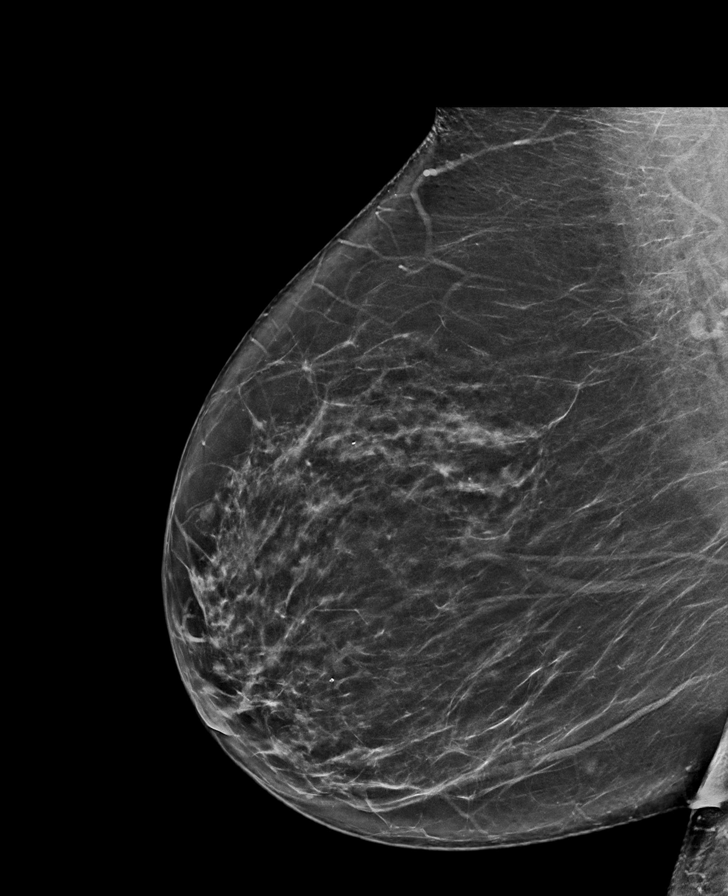

[L MLO tomo · tomo slice 44/87.0]
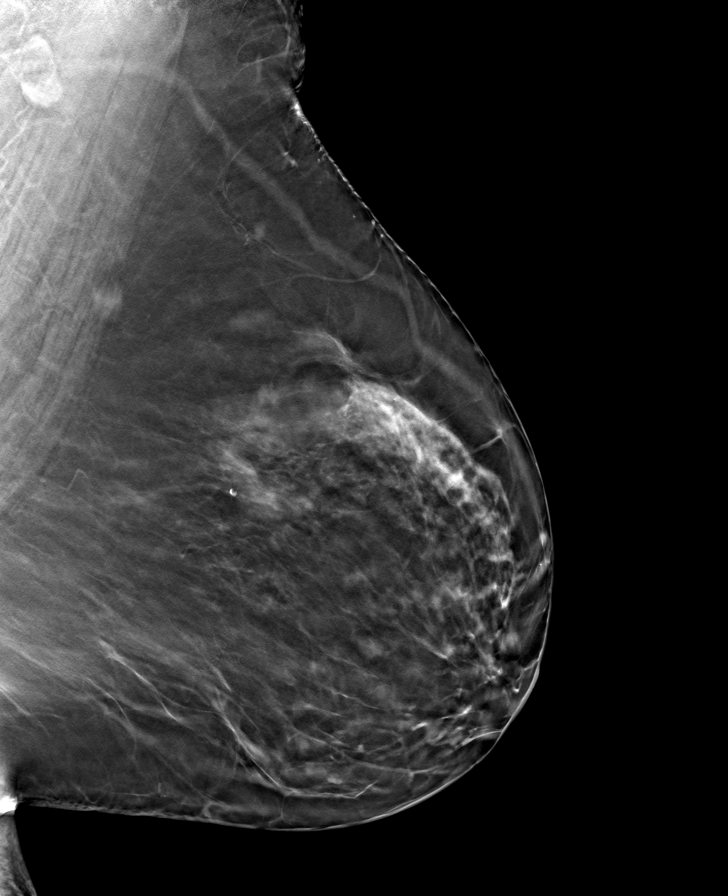

[L CC tomo · tomo slice 38/75.0]
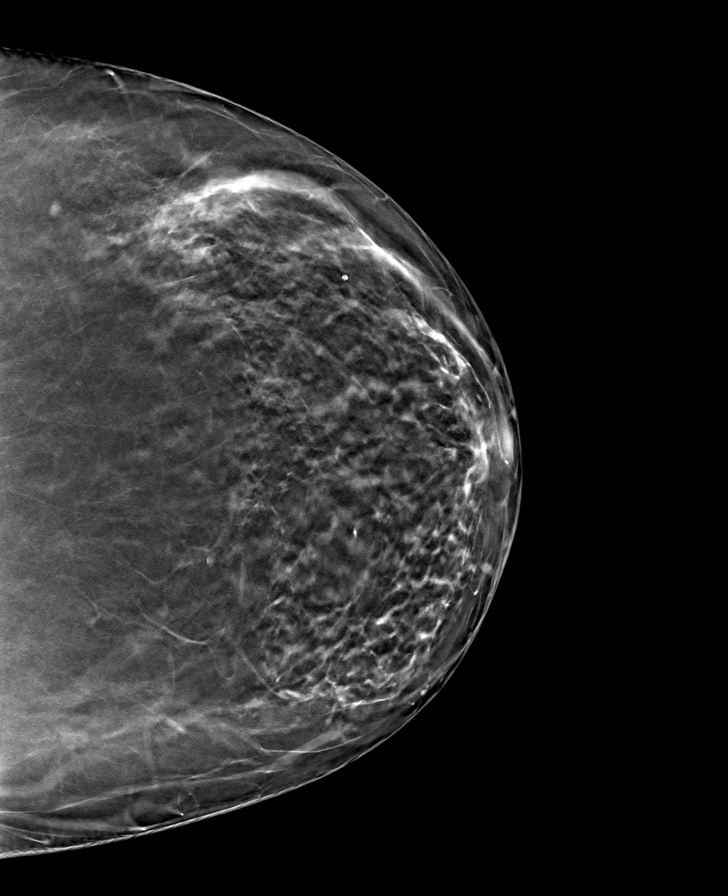

[R CC tomo · tomo slice 39/76.0]
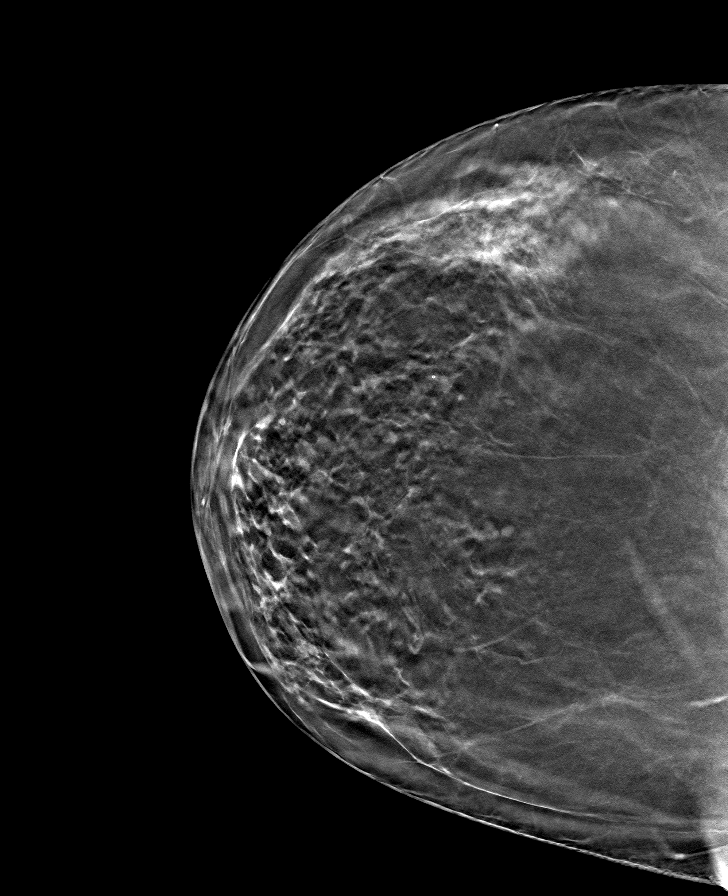

[R MLO tomo · tomo slice 43/86.0]
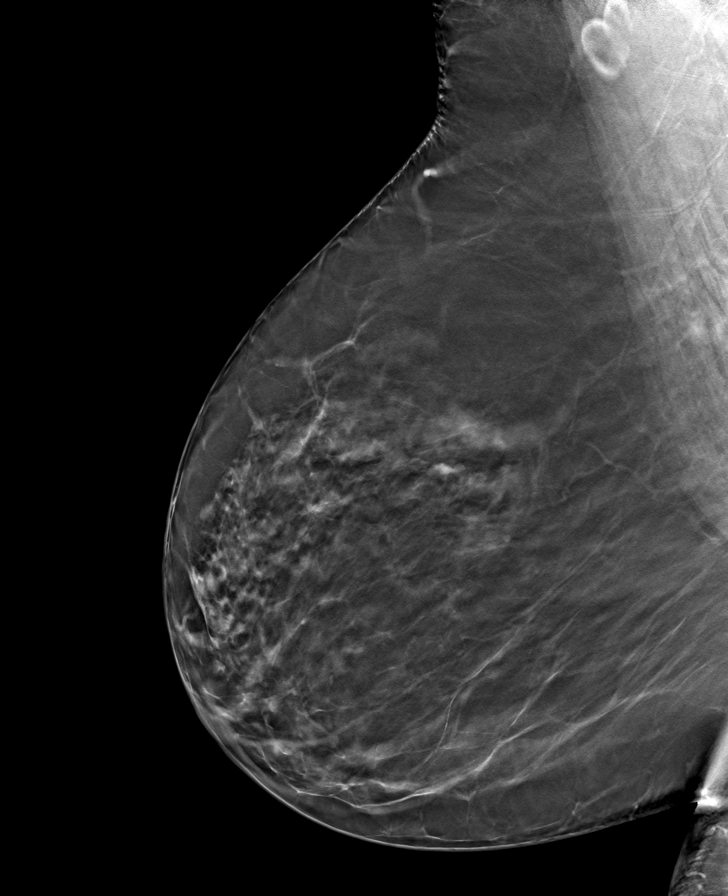

[8 of 24 positions shown; findings below may reference images not displayed]

ACR Breast Density Category b: There are scattered areas of
fibroglandular density.
FINDINGS: There are no findings suspicious for malignancy. Images were
processed with CAD.
IMPRESSION: No mammographic evidence of malignancy. A result letter of this
screening mammogram will be mailed directly to the patient.

RECOMMENDATION:
Screening mammogram in one year. (Code:[id number])

BI-RADS CATEGORY  1: Negative.

## 2020-03-09 ENCOUNTER — Other Ambulatory Visit: Payer: Self-pay | Admitting: Family Medicine

## 2020-03-09 DIAGNOSIS — E034 Atrophy of thyroid (acquired): Secondary | ICD-10-CM

## 2020-03-09 NOTE — Telephone Encounter (Signed)
Requested Prescriptions  Pending Prescriptions Disp Refills  . levothyroxine (SYNTHROID) 137 MCG tablet [Pharmacy Med Name: LEVOTHYROXINE 137 MCG TABLET] 90 tablet 2    Sig: TAKE 1 TABLET BY MOUTH EVERY DAY BEFORE BREAKFAST     Endocrinology:  Hypothyroid Agents Failed - 03/09/2020 10:06 AM      Failed - TSH needs to be rechecked within 3 months after an abnormal result. Refill until TSH is due.      Passed - TSH in normal range and within 360 days    TSH  Date Value Ref Range Status  11/13/2019 0.98 0.40 - 4.50 mIU/L Final         Passed - Valid encounter within last 12 months    Recent Outpatient Visits          3 months ago Annual physical exam   [facility] Smitty Cords, DO   9 months ago Gross hematuria   [facility] Kyung Rudd, Alison Stalling, NP   1 year ago Annual physical exam   [facility] Smitty Cords, DO   1 year ago Prediabetes   [facility], Netta Neat, DO   2 years ago Hypothyroidism due to acquired atrophy of thyroid   [facility] Althea Charon Netta Neat, DO      Future Appointments            In 2 months Althea Charon, Netta Neat, DO [facility]

## 2020-03-17 ENCOUNTER — Encounter: Payer: Self-pay | Admitting: Family Medicine

## 2020-05-21 ENCOUNTER — Other Ambulatory Visit: Payer: Self-pay | Admitting: *Deleted

## 2020-05-21 DIAGNOSIS — R7303 Prediabetes: Secondary | ICD-10-CM

## 2020-05-21 DIAGNOSIS — E034 Atrophy of thyroid (acquired): Secondary | ICD-10-CM

## 2020-05-22 ENCOUNTER — Other Ambulatory Visit: Payer: Self-pay

## 2020-05-22 ENCOUNTER — Other Ambulatory Visit: Payer: BC Managed Care – PPO

## 2020-05-22 DIAGNOSIS — R7303 Prediabetes: Secondary | ICD-10-CM | POA: Diagnosis not present

## 2020-05-22 DIAGNOSIS — E034 Atrophy of thyroid (acquired): Secondary | ICD-10-CM | POA: Diagnosis not present

## 2020-05-23 LAB — T4, FREE: Free T4: 1.7 ng/dL (ref 0.8–1.8)

## 2020-05-23 LAB — HEMOGLOBIN A1C
Hgb A1c MFr Bld: 5.5 % of total Hgb (ref <5.7)
Mean Plasma Glucose: 111 (calc)
eAG (mmol/L): 6.2 (calc)

## 2020-05-23 LAB — TSH: TSH: 1.31 mIU/L (ref 0.40–4.50)

## 2020-05-29 ENCOUNTER — Ambulatory Visit: Payer: BC Managed Care – PPO | Admitting: Family Medicine

## 2020-06-05 ENCOUNTER — Encounter: Payer: Self-pay | Admitting: Family Medicine

## 2020-06-05 ENCOUNTER — Other Ambulatory Visit: Payer: Self-pay

## 2020-06-05 ENCOUNTER — Other Ambulatory Visit: Payer: Self-pay | Admitting: Family Medicine

## 2020-06-05 ENCOUNTER — Ambulatory Visit (INDEPENDENT_AMBULATORY_CARE_PROVIDER_SITE_OTHER): Payer: BC Managed Care – PPO | Admitting: Family Medicine

## 2020-06-05 VITALS — BP 130/71 | HR 66 | Temp 97.5°F | Resp 16 | Ht 67.0 in | Wt 236.0 lb

## 2020-06-05 DIAGNOSIS — E034 Atrophy of thyroid (acquired): Secondary | ICD-10-CM

## 2020-06-05 DIAGNOSIS — R7303 Prediabetes: Secondary | ICD-10-CM | POA: Diagnosis not present

## 2020-06-05 DIAGNOSIS — R030 Elevated blood-pressure reading, without diagnosis of hypertension: Secondary | ICD-10-CM

## 2020-06-05 DIAGNOSIS — E7800 Pure hypercholesterolemia, unspecified: Secondary | ICD-10-CM

## 2020-06-05 DIAGNOSIS — E669 Obesity, unspecified: Secondary | ICD-10-CM | POA: Diagnosis not present

## 2020-06-05 DIAGNOSIS — Z0000 Encounter for general adult medical examination without abnormal findings: Secondary | ICD-10-CM

## 2020-06-05 MED ORDER — LEVOTHYROXINE SODIUM 137 MCG PO TABS: 137.0000 ug | ORAL_TABLET | Freq: Every day | ORAL | 3 refills | Status: DC

## 2020-06-05 NOTE — Assessment & Plan Note (Addendum)
Controlled hypothyroidism on current dose levothyroxine Last TSH Free T4 still in normal range Able to lose weight, lifestyle improved Refill Levothyroxine daily, add extra refills Next thyroid lab draw in 5 months w/ physical

## 2020-06-05 NOTE — Patient Instructions (Addendum)
Thank you for coming to the office today.  Recommend contact Dr Marice Potter at [facility] for updated pap smear, last was done in 2017.  Due for Flu Shot  Refilled Levothyroxine daily. Added refills  Thyroid panel looks good.  Recent Labs    11/13/19 0830 05/22/20 0827  HGBA1C 5.5 5.5   Weight down 9-10 lbs in past 6-7 months.  Please schedule a Follow-up Appointment to: Return in about 5 months (around 11/03/2020) for 5 month fasting lab only then 1 week later Annual Physical.  If you have any other questions or concerns, please feel free to call the office or send a message through MyChart. You may also schedule an earlier appointment if necessary.  Additionally, you may be receiving a survey about your experience at our office within a few days to 1 week by e-mail or mail. We value your feedback.  Saralyn Pilar, DO [facility], [place]

## 2020-06-05 NOTE — Assessment & Plan Note (Signed)
Still improved A1c down to 5.5, prior range 5.5 to 5.7 Concern with obesity, Pre-HTN, HLD  Plan:  1. Not on any therapy currently 2. Encourage improved lifestyle - low carb, low sugar diet, reduce portion size, again try to start regular exercise  Next A1c in 5 mo with physical

## 2020-06-05 NOTE — Assessment & Plan Note (Signed)
Improved weight loss with lifestyle changes diet Goal to improve exercise

## 2020-06-05 NOTE — Progress Notes (Signed)
Subjective:    Patient ID: Cindy Mccoy, female    DOB: 1961/02/22, 59 y.o.   MRN: [id number]  Cindy Mccoy is a 59 y.o. female presenting on 06/05/2020 for Hypothyroidism   HPI   FOLLOW-UP HYPOTHYROIDISM Update with last hypothyroidismlabs TSH and Free T4 normal range, 05/2020. - Current dose Levothyroxine is working well by her report still  Elevated A1c /OBESITY BMI >36 Last lab showed A1c down to 5.5 previously, had improved now most recent also at 5.5 (05/2020) - Weight is down 9-10 lbs in 7 months, she has improved diet, better food choices, reduced portions - Admits increased stress at home with family life stressors, caring for sick family members - Goal to improve exercise - Denies hypoglycemia  Health Maintenance: Due for Flu Shot - she may return or go to pharmacy and update [place]  Due for Pap Smear, last 2017, Dr Marice Potter - [facility]. She will call them to schedule.  UTD COVID vaccine.  Depression screen [facility] 2/9 06/05/2020 11/19/2019 10/11/2018  Decreased Interest 0 0 0  Down, Depressed, Hopeless 0 0 0  PHQ - 2 Score 0 0 0    Social History   Tobacco Use  . Smoking status: Former Games developer  . Smokeless tobacco: Former Neurosurgeon  . Tobacco comment: 29 pack per year history, quit several years ago  Substance Use Topics  . Alcohol use: No    Comment: 1-2 drinks per month  . Drug use: No    Comment: Remote marijuana in highschool- no injections    Review of Systems Per HPI unless specifically indicated above     Objective:    BP 130/71   Pulse 66   Temp (!) 97.5 F (36.4 C) (Temporal)   Resp 16   Ht 5\' 7"  (1.702 m)   Wt 236 lb (107 kg)   LMP 09/23/2012   BMI 36.96 kg/m   Wt Readings from Last 3 Encounters:  06/05/20 236 lb (107 kg)  11/19/19 245 lb (111.1 kg)  10/11/18 235 lb 9.6 oz (106.9 kg)    Physical Exam Vitals and nursing note reviewed.  Constitutional:      General: She is not in acute distress.    Appearance: She is  well-developed. She is obese. She is not diaphoretic.     Comments: Well-appearing, comfortable, cooperative  HENT:     Head: Normocephalic and atraumatic.  Eyes:     General:        Right eye: No discharge.        Left eye: No discharge.     Conjunctiva/sclera: Conjunctivae normal.  Cardiovascular:     Rate and Rhythm: Normal rate.  Pulmonary:     Effort: Pulmonary effort is normal.  Skin:    General: Skin is warm and dry.     Findings: No erythema or rash.  Neurological:     Mental Status: She is alert and oriented to person, place, and time.  Psychiatric:        Behavior: Behavior normal.     Comments: Well groomed, good eye contact, normal speech and thoughts       Results for orders placed or performed in visit on 05/21/20  Hemoglobin A1c  Result Value Ref Range   Hgb A1c MFr Bld 5.5 <5.7 % of total Hgb   Mean Plasma Glucose 111 (calc)   eAG (mmol/L) 6.2 (calc)  T4, free  Result Value Ref Range   Free T4 1.7 0.8 - 1.8 ng/dL  TSH  Result Value Ref Range   TSH 1.31 0.40 - 4.50 mIU/L      Assessment & Plan:   Problem List Items Addressed This Visit    Prediabetes    Still improved A1c down to 5.5, prior range 5.5 to 5.7 Concern with obesity, Pre-HTN, HLD  Plan:  1. Not on any therapy currently 2. Encourage improved lifestyle - low carb, low sugar diet, reduce portion size, again try to start regular exercise  Next A1c in 5 mo with physical       Obesity (BMI 35.0-39.9 without comorbidity)    Improved weight loss with lifestyle changes diet Goal to improve exercise      Hypothyroidism - Primary    Controlled hypothyroidism on current dose levothyroxine Last TSH Free T4 still in normal range Able to lose weight, lifestyle improved Refill Levothyroxine daily, add extra refills Next thyroid lab draw in 5 months w/ physical      Relevant Medications   levothyroxine (SYNTHROID) 137 MCG tablet      Meds ordered this encounter  Medications  .  levothyroxine (SYNTHROID) 137 MCG tablet    Sig: Take 1 tablet (137 mcg total) by mouth daily before breakfast.    Dispense:  90 tablet    Refill:  3     Follow up plan: Return in about 5 months (around 11/03/2020) for 5 month fasting lab only then 1 week later Annual Physical.  Future labs ordered for 10/28/20  Saralyn Pilar, DO [facility] 06/05/2020, 8:49 AM

## 2020-06-27 DIAGNOSIS — Z23 Encounter for immunization: Secondary | ICD-10-CM | POA: Diagnosis not present

## 2020-07-24 ENCOUNTER — Other Ambulatory Visit: Payer: Self-pay | Admitting: Family Medicine

## 2020-07-24 DIAGNOSIS — K219 Gastro-esophageal reflux disease without esophagitis: Secondary | ICD-10-CM

## 2020-07-24 NOTE — Telephone Encounter (Signed)
Requested medications are due for refill today yes  Requested medications are on the active medication list yes  Last refill 9/1  Last visit 11/2017 last visit esophagitis or GERD was assessed.  Future visit scheduled 10/2020  Notes to clinic Failed protocol due to no valid visit within 12  months.

## 2020-07-31 ENCOUNTER — Other Ambulatory Visit: Payer: Self-pay | Admitting: Family Medicine

## 2020-07-31 DIAGNOSIS — K219 Gastro-esophageal reflux disease without esophagitis: Secondary | ICD-10-CM

## 2020-07-31 NOTE — Telephone Encounter (Signed)
Requested medication (s) are due for refill today: expired medication  Requested medication (s) are on the active medication list: yes  Last refill:  07/10/2019 #90 3 refills  Future visit scheduled: yes in 3 months  Notes to clinic:  expired medication . Do you want to renew Rx? And give enough med for 3 month supply until future visit .     Requested Prescriptions  Pending Prescriptions Disp Refills   omeprazole (PRILOSEC) 20 MG capsule 90 capsule 3    Sig: Take 1 capsule (20 mg total) by mouth daily.      Gastroenterology: Proton Pump Inhibitors Passed - 07/31/2020  3:24 PM      Passed - Valid encounter within last 12 months    Recent Outpatient Visits           1 month ago Hypothyroidism due to acquired atrophy of thyroid   [facility] [place], Netta Neat, DO   8 months ago Annual physical exam   [facility] Smitty Cords, DO   1 year ago Gross hematuria   [facility] Galen Manila, NP   1 year ago Annual physical exam   [facility] Smitty Cords, DO   2 years ago Prediabetes   [facility] Althea Charon, Netta Neat, DO       Future Appointments             In 3 months Althea Charon, Netta Neat, DO [facility], [facility]

## 2020-07-31 NOTE — Telephone Encounter (Signed)
Medication:  omeprazole (PRILOSEC) 20 MG capsule [[id number]]   Has the patient contacted their pharmacy? Yes   (Agent: If yes, when and what did the pharmacy advise?)  Preferred Pharmacy (with phone number or street name): to ca ll the office  [id number] [place], [place] - [address]  [address] [place], [place] [address]  Phone:  [phone] Fax:  [phone]  Agent: Please be advised that RX refills may take up to 3 business days. We ask that you follow-up with your pharmacy.

## 2020-08-01 MED ORDER — OMEPRAZOLE 20 MG PO CPDR: 20.0000 mg | DELAYED_RELEASE_CAPSULE | Freq: Every day | ORAL | 1 refills | Status: DC

## 2020-10-09 ENCOUNTER — Other Ambulatory Visit: Payer: Self-pay | Admitting: Family Medicine

## 2020-10-09 DIAGNOSIS — Z1231 Encounter for screening mammogram for malignant neoplasm of breast: Secondary | ICD-10-CM

## 2020-10-27 ENCOUNTER — Other Ambulatory Visit: Payer: Self-pay | Admitting: *Deleted

## 2020-10-27 DIAGNOSIS — Z0000 Encounter for general adult medical examination without abnormal findings: Secondary | ICD-10-CM

## 2020-10-27 DIAGNOSIS — E669 Obesity, unspecified: Secondary | ICD-10-CM

## 2020-10-27 DIAGNOSIS — E7800 Pure hypercholesterolemia, unspecified: Secondary | ICD-10-CM

## 2020-10-27 DIAGNOSIS — R7303 Prediabetes: Secondary | ICD-10-CM

## 2020-10-27 DIAGNOSIS — E034 Atrophy of thyroid (acquired): Secondary | ICD-10-CM

## 2020-10-27 DIAGNOSIS — R030 Elevated blood-pressure reading, without diagnosis of hypertension: Secondary | ICD-10-CM

## 2020-10-28 ENCOUNTER — Other Ambulatory Visit: Payer: BC Managed Care – PPO

## 2020-10-28 ENCOUNTER — Other Ambulatory Visit: Payer: Self-pay

## 2020-10-28 DIAGNOSIS — E7800 Pure hypercholesterolemia, unspecified: Secondary | ICD-10-CM | POA: Diagnosis not present

## 2020-10-28 DIAGNOSIS — R7303 Prediabetes: Secondary | ICD-10-CM | POA: Diagnosis not present

## 2020-10-28 DIAGNOSIS — E034 Atrophy of thyroid (acquired): Secondary | ICD-10-CM | POA: Diagnosis not present

## 2020-10-28 DIAGNOSIS — R030 Elevated blood-pressure reading, without diagnosis of hypertension: Secondary | ICD-10-CM | POA: Diagnosis not present

## 2020-10-29 LAB — COMPLETE METABOLIC PANEL WITH GFR
AG Ratio: 1.8 (calc) (ref 1.0–2.5)
ALT: 15 U/L (ref 6–29)
AST: 15 U/L (ref 10–35)
Albumin: 4.2 g/dL (ref 3.6–5.1)
Alkaline phosphatase (APISO): 73 U/L (ref 37–153)
BUN: 15 mg/dL (ref 7–25)
CO2: 26 mmol/L (ref 20–32)
Calcium: 9.2 mg/dL (ref 8.6–10.4)
Chloride: 106 mmol/L (ref 98–110)
Creat: 0.84 mg/dL (ref 0.50–1.05)
GFR, Est African American: 88 mL/min/{1.73_m2} (ref >=60)
GFR, Est Non African American: 76 mL/min/{1.73_m2} (ref >=60)
Globulin: 2.3 g/dL (calc) (ref 1.9–3.7)
Glucose, Bld: 101 mg/dL — ABNORMAL HIGH (ref 65–99)
Potassium: 4.4 mmol/L (ref 3.5–5.3)
Sodium: 143 mmol/L (ref 135–146)
Total Bilirubin: 0.5 mg/dL (ref 0.2–1.2)
Total Protein: 6.5 g/dL (ref 6.1–8.1)

## 2020-10-29 LAB — CBC WITH DIFFERENTIAL/PLATELET
Absolute Monocytes: 493 cells/uL (ref 200–950)
Basophils Absolute: 53 cells/uL (ref 0–200)
Basophils Relative: 1 %
Eosinophils Absolute: 180 cells/uL (ref 15–500)
Eosinophils Relative: 3.4 %
HCT: 44.8 % (ref 35.0–45.0)
Hemoglobin: 15.2 g/dL (ref 11.7–15.5)
Lymphs Abs: 1564 cells/uL (ref 850–3900)
MCH: 30.4 pg (ref 27.0–33.0)
MCHC: 33.9 g/dL (ref 32.0–36.0)
MCV: 89.6 fL (ref 80.0–100.0)
MPV: 11 fL (ref 7.5–12.5)
Monocytes Relative: 9.3 %
Neutro Abs: 3010 cells/uL (ref 1500–7800)
Neutrophils Relative %: 56.8 %
Platelets: 209 10*3/uL (ref 140–400)
RBC: 5 10*6/uL (ref 3.80–5.10)
RDW: 13.1 % (ref 11.0–15.0)
Total Lymphocyte: 29.5 %
WBC: 5.3 10*3/uL (ref 3.8–10.8)

## 2020-10-29 LAB — TSH: TSH: 1.25 mIU/L (ref 0.40–4.50)

## 2020-10-29 LAB — T4, FREE: Free T4: 1.7 ng/dL (ref 0.8–1.8)

## 2020-10-29 LAB — HEMOGLOBIN A1C
Hgb A1c MFr Bld: 5.4 % of total Hgb (ref <5.7)
Mean Plasma Glucose: 108 mg/dL
eAG (mmol/L): 6 mmol/L

## 2020-10-29 LAB — LIPID PANEL
Cholesterol: 165 mg/dL (ref <200)
HDL: 54 mg/dL (ref >=50)
LDL Cholesterol (Calc): 92 mg/dL (calc)
Non-HDL Cholesterol (Calc): 111 mg/dL (calc) (ref <130)
Total CHOL/HDL Ratio: 3.1 (calc) (ref <5.0)
Triglycerides: 93 mg/dL (ref <150)

## 2020-11-04 ENCOUNTER — Encounter: Payer: Self-pay | Admitting: Family Medicine

## 2020-11-04 ENCOUNTER — Ambulatory Visit (INDEPENDENT_AMBULATORY_CARE_PROVIDER_SITE_OTHER): Payer: BC Managed Care – PPO | Admitting: Family Medicine

## 2020-11-04 ENCOUNTER — Other Ambulatory Visit: Payer: Self-pay

## 2020-11-04 ENCOUNTER — Other Ambulatory Visit: Payer: Self-pay | Admitting: Family Medicine

## 2020-11-04 VITALS — BP 140/80 | HR 83 | Temp 97.3°F | Resp 17 | Ht 67.0 in | Wt 234.0 lb

## 2020-11-04 DIAGNOSIS — Z0000 Encounter for general adult medical examination without abnormal findings: Secondary | ICD-10-CM

## 2020-11-04 DIAGNOSIS — R030 Elevated blood-pressure reading, without diagnosis of hypertension: Secondary | ICD-10-CM

## 2020-11-04 DIAGNOSIS — E034 Atrophy of thyroid (acquired): Secondary | ICD-10-CM | POA: Diagnosis not present

## 2020-11-04 DIAGNOSIS — R7303 Prediabetes: Secondary | ICD-10-CM

## 2020-11-04 NOTE — Patient Instructions (Addendum)
Thank you for coming to the office today.  [facility]  Toll-Free  [phone] [email or web address] More contact information  [facility] [address] [place], [place] [address] [phone]  [facility] [address] [place], [place] [address] [phone]  ------------------------  Omeprazole 20mg  - phase off of this, taper it off take one every other day for a few weeks, then can go to every 3rd day.   DUE for FASTING BLOOD WORK (no food or drink after midnight before the lab appointment, only water or coffee without cream/sugar on the morning of)  SCHEDULE "Lab Only" visit in the morning at the clinic for lab draw in 6 MONTHS   - Make sure Lab Only appointment is at about 1 week before your next appointment, so that results will be available  For Lab Results, once available within 2-3 days of blood draw, you can can log in to MyChart online to view your results and a brief explanation. Also, we can discuss results at next follow-up visit.   Please schedule a Follow-up Appointment to: Return in about 6 months (around 05/07/2021) for 6 month fasting lab only then 1 week later Follow-up BP, Thyroid, labs.  If you have any other questions or concerns, please feel free to call the office or send a message through MyChart. You may also schedule an earlier appointment if necessary.  Additionally, you may be receiving a survey about your experience at our office within a few days to 1 week by e-mail or mail. We value your feedback.  05/09/2021, DO [facility], [facility]

## 2020-11-04 NOTE — Assessment & Plan Note (Signed)
Still improved A1c down to 5.4 prior range 5.5 to 5.7 Concern with obesity, Pre-HTN, HLD  Plan:  1. Not on any therapy currently 2. Encourage improved lifestyle - low carb, low sugar diet, reduce portion size, again try to start regular exercise

## 2020-11-04 NOTE — Progress Notes (Signed)
Subjective:    Patient ID: Cindy Mccoy, female    DOB: 1961-02-15, 60 y.o.   MRN: [id number]  Cindy Mccoy is a 60 y.o. female presenting on 11/04/2020 for Annual Exam   HPI  Here for Annual Physical and Lab Review.  FOLLOW-UP HYPOTHYROIDISM Update with last hypothyroidismlabs TSH and Free T4 normal range, 10/2020 - Current dose Levothyroxine is working well by her report still  Elevated A1c /OBESITY BMI >36 Last lab showed A1c down to 5.4, improved. Prior 5.5 Weight down 2-3 lbs in past 6 months. Down 11 lbs in 1 year. She has improved diet, better food choices, reduced portions - Admits increased stress at home with family life stressors, caring for sick family members - Goal to improve exercise - Denies hypoglycemia  Elevated BP without HTN Reports some readings of elevated BP recently with multiple life stressors, lost loved ones affecting her mood/stress/anxiety now. She has not had regular BP readings recently. In past in office had been normal Current Meds - Not on medication   Lifestyle: Goal to improve diet lifestyle Denies CP, dyspnea, HA, edema, dizziness / lightheadedness  GERD On omeprazole 20mg  daily.  Health Maintenance: Mammogram scheduled 10/2020 Due for Pap Smear, last 2017, Dr 2018 [facility]. She will call them to schedule.  UTD COVID vaccine.  Depression screen [facility] 2/9 06/05/2020 11/19/2019 10/11/2018  Decreased Interest 0 0 0  Down, Depressed, Hopeless 0 0 0  PHQ - 2 Score 0 0 0   No flowsheet data found.     Past Medical History:  Diagnosis Date  . Anxiety   . Breast mass, left   . Collapsed lung 2005  . Dermatitis    contact  . Family history of malignant neoplasm of breast   . History of chicken pox   . Pure hypercholesterolemia   . Unspecified menopausal and postmenopausal disorder    Past Surgical History:  Procedure Laterality Date  . BREAST BIOPSY  1981  . BREAST BIOPSY Left    Social History    Socioeconomic History  . Marital status: Married    Spouse name: Not on file  . Number of children: Not on file  . Years of education: Not on file  . Highest education level: Not on file  Occupational History  . Occupation: CNA  Tobacco Use  . Smoking status: Former 2006  . Smokeless tobacco: Never Used  . Tobacco comment: 29 pack per year history, quit several years ago  Vaping Use  . Vaping Use: Never used  Substance and Sexual Activity  . Alcohol use: No    Comment: 1-2 drinks per month  . Drug use: No    Comment: Remote marijuana in highschool- no injections  . Sexual activity: Not on file  Other Topics Concern  . Not on file  Social History Narrative  . Not on file   Social Determinants of Health   Financial Resource Strain: Not on file  Food Insecurity: Not on file  Transportation Needs: Not on file  Physical Activity: Not on file  Stress: Not on file  Social Connections: Not on file  Intimate Partner Violence: Not on file   Family History  Problem Relation Age of Onset  . Cancer Mother        breast  . COPD Mother   . Heart disease Father        CHF   Current Outpatient Medications on File Prior to Visit  Medication Sig  . levothyroxine (  SYNTHROID) 137 MCG tablet Take 1 tablet (137 mcg total) by mouth daily before breakfast.  . Multiple Vitamin (MULTIVITAMIN) tablet Take 1 tablet by mouth daily.  Marland Kitchen. omeprazole (PRILOSEC) 20 MG capsule Take 1 capsule (20 mg total) by mouth daily before breakfast.   No current facility-administered medications on file prior to visit.    Review of Systems  Constitutional: Negative for activity change, appetite change, chills, diaphoresis, fatigue and fever.  HENT: Negative for congestion and hearing loss.   Eyes: Negative for visual disturbance.  Respiratory: Negative for apnea, cough, chest tightness, shortness of breath and wheezing.   Cardiovascular: Negative for chest pain, palpitations and leg swelling.   Gastrointestinal: Negative for abdominal pain, constipation, diarrhea, nausea and vomiting.  Endocrine: Negative for cold intolerance.  Genitourinary: Negative for difficulty urinating, dysuria, frequency and hematuria.  Musculoskeletal: Negative for arthralgias and neck pain.  Skin: Negative for rash.  Allergic/Immunologic: Negative for environmental allergies.  Neurological: Negative for dizziness, weakness, light-headedness, numbness and headaches.  Hematological: Negative for adenopathy.  Psychiatric/Behavioral: Negative for behavioral problems, dysphoric mood and sleep disturbance.   Per HPI unless specifically indicated above      Objective:    BP 140/80 (BP Location: Left Arm, Cuff Size: Normal)   Pulse 83   Temp (!) 97.3 F (36.3 C) (Temporal)   Resp 17   Ht 5\' 7"  (1.702 m)   Wt 234 lb (106.1 kg)   LMP 09/23/2012   SpO2 99%   BMI 36.65 kg/m   Wt Readings from Last 3 Encounters:  11/04/20 234 lb (106.1 kg)  06/05/20 236 lb (107 kg)  11/19/19 245 lb (111.1 kg)    Physical Exam Vitals and nursing note reviewed.  Constitutional:      General: She is not in acute distress.    Appearance: She is well-developed. She is not diaphoretic.     Comments: Well-appearing, comfortable, cooperative  HENT:     Head: Normocephalic and atraumatic.  Eyes:     General:        Right eye: No discharge.        Left eye: No discharge.     Conjunctiva/sclera: Conjunctivae normal.     Pupils: Pupils are equal, round, and reactive to light.  Neck:     Thyroid: No thyromegaly.  Cardiovascular:     Rate and Rhythm: Normal rate and regular rhythm.     Heart sounds: Normal heart sounds. No murmur heard.   Pulmonary:     Effort: Pulmonary effort is normal. No respiratory distress.     Breath sounds: Normal breath sounds. No wheezing or rales.  Abdominal:     General: Bowel sounds are normal. There is no distension.     Palpations: Abdomen is soft. There is no mass.     Tenderness:  There is no abdominal tenderness.  Musculoskeletal:        General: No tenderness. Normal range of motion.     Cervical back: Normal range of motion and neck supple.     Comments: Upper / Lower Extremities: - Normal muscle tone, strength bilateral upper extremities 5/5, lower extremities 5/5  Lymphadenopathy:     Cervical: No cervical adenopathy.  Skin:    General: Skin is warm and dry.     Findings: No erythema or rash.  Neurological:     Mental Status: She is alert and oriented to person, place, and time.     Comments: Distal sensation intact to light touch all extremities  Psychiatric:  Behavior: Behavior normal.     Comments: Well groomed, good eye contact, normal speech and thoughts    Results for orders placed or performed in visit on 10/27/20  T4, free  Result Value Ref Range   Free T4 1.7 0.8 - 1.8 ng/dL  TSH  Result Value Ref Range   TSH 1.25 0.40 - 4.50 mIU/L  Lipid panel  Result Value Ref Range   Cholesterol 165 <200 mg/dL   HDL 54 > OR = 50 mg/dL   Triglycerides 93 <124 mg/dL   LDL Cholesterol (Calc) 92 mg/dL (calc)   Total CHOL/HDL Ratio 3.1 <5.0 (calc)   Non-HDL Cholesterol (Calc) 111 <130 mg/dL (calc)  COMPLETE METABOLIC PANEL WITH GFR  Result Value Ref Range   Glucose, Bld 101 (H) 65 - 99 mg/dL   BUN 15 7 - 25 mg/dL   Creat 5.80 9.98 - 3.38 mg/dL   GFR, Est Non African American 76 > OR = 60 mL/min/1.5m2   GFR, Est African American 88 > OR = 60 mL/min/1.68m2   BUN/Creatinine Ratio NOT APPLICABLE 6 - 22 (calc)   Sodium 143 135 - 146 mmol/L   Potassium 4.4 3.5 - 5.3 mmol/L   Chloride 106 98 - 110 mmol/L   CO2 26 20 - 32 mmol/L   Calcium 9.2 8.6 - 10.4 mg/dL   Total Protein 6.5 6.1 - 8.1 g/dL   Albumin 4.2 3.6 - 5.1 g/dL   Globulin 2.3 1.9 - 3.7 g/dL (calc)   AG Ratio 1.8 1.0 - 2.5 (calc)   Total Bilirubin 0.5 0.2 - 1.2 mg/dL   Alkaline phosphatase (APISO) 73 37 - 153 U/L   AST 15 10 - 35 U/L   ALT 15 6 - 29 U/L  CBC with Differential/Platelet   Result Value Ref Range   WBC 5.3 3.8 - 10.8 Thousand/uL   RBC 5.00 3.80 - 5.10 Million/uL   Hemoglobin 15.2 11.7 - 15.5 g/dL   HCT 25.0 53.9 - 76.7 %   MCV 89.6 80.0 - 100.0 fL   MCH 30.4 27.0 - 33.0 pg   MCHC 33.9 32.0 - 36.0 g/dL   RDW 34.1 93.7 - 90.2 %   Platelets 209 140 - 400 Thousand/uL   MPV 11.0 7.5 - 12.5 fL   Neutro Abs 3,010 1,500 - 7,800 cells/uL   Lymphs Abs 1,564 850 - 3,900 cells/uL   Absolute Monocytes 493 200 - 950 cells/uL   Eosinophils Absolute 180 15 - 500 cells/uL   Basophils Absolute 53 0 - 200 cells/uL   Neutrophils Relative % 56.8 %   Total Lymphocyte 29.5 %   Monocytes Relative 9.3 %   Eosinophils Relative 3.4 %   Basophils Relative 1.0 %  Hemoglobin A1c  Result Value Ref Range   Hgb A1c MFr Bld 5.4 <5.7 % of total Hgb   Mean Plasma Glucose 108 mg/dL   eAG (mmol/L) 6.0 mmol/L      Assessment & Plan:   Problem List Items Addressed This Visit    Prediabetes    Still improved A1c down to 5.4 prior range 5.5 to 5.7 Concern with obesity, Pre-HTN, HLD  Plan:  1. Not on any therapy currently 2. Encourage improved lifestyle - low carb, low sugar diet, reduce portion size, again try to start regular exercise      Pre-hypertension   Morbid obesity (HCC)   Hypothyroidism    Controlled hypothyroidism on current dose levothyroxine Last TSH Free T4 still in normal range Able to lose weight, lifestyle  improved Continue current Levothyroxine daily  F/u lab 6 months        Other Visit Diagnoses    Annual physical exam    -  Primary      Updated Health Maintenance information - Due for routine GYN apt for Pap Smear - Upcoming Mammogram screening 11/13/20 Vaccines UTD Reviewed recent lab results with patient Encouraged improvement to lifestyle with diet and exercise BMI >36 consistent with morbid obesity with comorbid factors PreHTN, PreDM, GERD, Hypothyroid - Goal of weight loss  #PreHTN Still mild elevated BP on recheck, improved,  near goal, discussed likely with acute stressors lately affecting her, will need some home outside BP readings, comparison, can follow-up in 6 months, sooner if elevated readings. No prior confirm HTN and no prior medication before    No orders of the defined types were placed in this encounter.     Follow up plan: Return in about 6 months (around 05/07/2021) for 6 month fasting lab only then 1 week later Follow-up BP, Thyroid, labs.  Future labs for 05/05/21  Saralyn Pilar, DO [facility] 11/04/2020, 11:01 AM

## 2020-11-04 NOTE — Assessment & Plan Note (Signed)
Controlled hypothyroidism on current dose levothyroxine Last TSH Free T4 still in normal range Able to lose weight, lifestyle improved Continue current Levothyroxine daily  F/u lab 6 months

## 2020-11-13 ENCOUNTER — Other Ambulatory Visit: Payer: Self-pay

## 2020-11-13 ENCOUNTER — Ambulatory Visit
Admission: RE | Admit: 2020-11-13 | Discharge: 2020-11-13 | Disposition: A | Discharge status: Home / Self Care | Payer: BC Managed Care – PPO | Source: Ambulatory Visit | Attending: Family Medicine | Admitting: Family Medicine

## 2020-11-13 DIAGNOSIS — Z1231 Encounter for screening mammogram for malignant neoplasm of breast: Secondary | ICD-10-CM | POA: Diagnosis not present

## 2020-12-02 ENCOUNTER — Other Ambulatory Visit: Payer: Self-pay | Admitting: Family Medicine

## 2020-12-02 DIAGNOSIS — E034 Atrophy of thyroid (acquired): Secondary | ICD-10-CM

## 2021-02-02 ENCOUNTER — Other Ambulatory Visit: Payer: Self-pay | Admitting: Family Medicine

## 2021-02-02 DIAGNOSIS — K219 Gastro-esophageal reflux disease without esophagitis: Secondary | ICD-10-CM

## 2021-04-27 ENCOUNTER — Other Ambulatory Visit: Payer: Self-pay

## 2021-04-27 DIAGNOSIS — E034 Atrophy of thyroid (acquired): Secondary | ICD-10-CM

## 2021-05-02 ENCOUNTER — Other Ambulatory Visit: Payer: Self-pay | Admitting: Family Medicine

## 2021-05-02 DIAGNOSIS — K219 Gastro-esophageal reflux disease without esophagitis: Secondary | ICD-10-CM

## 2021-05-02 NOTE — Telephone Encounter (Signed)
Requested Prescriptions  Pending Prescriptions Disp Refills  . omeprazole (PRILOSEC) 20 MG capsule [Pharmacy Med Name: OMEPRAZOLE DR 20 MG CAPSULE] 90 capsule 1    Sig: TAKE 1 CAPSULE BY MOUTH EVERY DAY BEFORE BREAKFAST     Gastroenterology: Proton Pump Inhibitors Passed - 05/02/2021 12:49 AM      Passed - Valid encounter within last 12 months    Recent Outpatient Visits          5 months ago Annual physical exam   [facility] Smitty Cords, DO   11 months ago Hypothyroidism due to acquired atrophy of thyroid   [facility] Althea Charon, Netta Neat, DO   1 year ago Annual physical exam   [facility] Smitty Cords, DO   1 year ago Gross hematuria   [facility] Galen Manila, NP   2 years ago Annual physical exam   [facility] Smitty Cords, DO      Future Appointments            In 1 week Althea Charon, Netta Neat, DO [facility]

## 2021-05-05 ENCOUNTER — Other Ambulatory Visit: Payer: Self-pay

## 2021-05-05 ENCOUNTER — Other Ambulatory Visit: Payer: BC Managed Care – PPO

## 2021-05-05 DIAGNOSIS — E034 Atrophy of thyroid (acquired): Secondary | ICD-10-CM | POA: Diagnosis not present

## 2021-05-05 LAB — TSH: TSH: 0.69 mIU/L (ref 0.40–4.50)

## 2021-05-05 LAB — T4, FREE: Free T4: 1.6 ng/dL (ref 0.8–1.8)

## 2021-05-12 ENCOUNTER — Other Ambulatory Visit: Payer: Self-pay | Admitting: Family Medicine

## 2021-05-12 ENCOUNTER — Ambulatory Visit: Payer: BC Managed Care – PPO | Admitting: Family Medicine

## 2021-05-12 ENCOUNTER — Other Ambulatory Visit: Payer: Self-pay

## 2021-05-12 ENCOUNTER — Encounter: Payer: Self-pay | Admitting: Family Medicine

## 2021-05-12 VITALS — BP 128/79 | HR 71 | Ht 67.0 in | Wt 241.6 lb

## 2021-05-12 DIAGNOSIS — E034 Atrophy of thyroid (acquired): Secondary | ICD-10-CM | POA: Diagnosis not present

## 2021-05-12 DIAGNOSIS — Z0000 Encounter for general adult medical examination without abnormal findings: Secondary | ICD-10-CM

## 2021-05-12 DIAGNOSIS — R7303 Prediabetes: Secondary | ICD-10-CM

## 2021-05-12 DIAGNOSIS — E7800 Pure hypercholesterolemia, unspecified: Secondary | ICD-10-CM

## 2021-05-12 DIAGNOSIS — R030 Elevated blood-pressure reading, without diagnosis of hypertension: Secondary | ICD-10-CM

## 2021-05-12 MED ORDER — LEVOTHYROXINE SODIUM 137 MCG PO TABS: 137.0000 ug | ORAL_TABLET | Freq: Every day | ORAL | 3 refills | Status: DC

## 2021-05-12 NOTE — Assessment & Plan Note (Signed)
Controlled hypothyroidism on current dose levothyroxine Last TSH Free T4 still in normal range Weight gain Continue current Levothyroxine daily refilled  F/u lab 6 months

## 2021-05-12 NOTE — Patient Instructions (Addendum)
Thank you for coming to the office today.  COVID Vaccine updated booster at pharmacy  Pap smear at [facility]  DUE for FASTING BLOOD WORK (no food or drink after midnight before the lab appointment, only water or coffee without cream/sugar on the morning of)  SCHEDULE "Lab Only" visit in the morning at the clinic for lab draw in 6 MONTHS  - Make sure Lab Only appointment is at about 1 week before your next appointment, so that results will be available  For Lab Results, once available within 2-3 days of blood draw, you can can log in to MyChart online to view your results and a brief explanation. Also, we can discuss results at next follow-up visit.   Please schedule a Follow-up Appointment to: Return in about 6 months (around 11/09/2021) for 6 month fasting lab only then 1 week later Annual Physical.  If you have any other questions or concerns, please feel free to call the office or send a message through MyChart. You may also schedule an earlier appointment if necessary.  Additionally, you may be receiving a survey about your experience at our office within a few days to 1 week by e-mail or mail. We value your feedback.  Saralyn Pilar, DO [facility], [place]

## 2021-05-12 NOTE — Progress Notes (Signed)
Subjective:    Patient ID: Cindy Mccoy, female    DOB: 1961-04-11, 60 y.o.   MRN: [id number]  Cindy Mccoy is a 60 y.o. female presenting on 05/12/2021 for Hypothyroidism and Hypertension   HPI  FOLLOW-UP HYPOTHYROIDISM Update with last hypothyroidism labs TSH and Free T4 normal range reviewed, 04/2021 - Current dose Levothyroxine is working well  Admits some weight gain Stressors, with anxiety and friends/family passed and grandson having mental breakdown was hospitalized.  Elevated A1c / OBESITY BMI >37 Weight gain Stressors listed above   Elevated BP without HTN Current Meds - Not on medication   Lifestyle: Goal to improve diet lifestyle Denies CP, dyspnea, HA, edema, dizziness / lightheadedness   GERD On omeprazole 20mg  daily.   Health Maintenance: Mammogram 10/2020  Due for Pap Smear, last 2017, Dr 2018 [facility]. She will call them to schedule.   UTD COVID vaccine. Due for booster updated version   Depression screen [facility] 2/9 05/12/2021 06/05/2020 11/19/2019  Decreased Interest 0 0 0  Down, Depressed, Hopeless 0 0 0  PHQ - 2 Score 0 0 0  Altered sleeping 0 - -  Tired, decreased energy 0 - -  Change in appetite 0 - -  Feeling bad or failure about yourself  0 - -  Trouble concentrating 0 - -  Moving slowly or fidgety/restless 0 - -  Suicidal thoughts 0 - -  PHQ-9 Score 0 - -  Difficult doing work/chores Not difficult at all - -    Social History   Tobacco Use   Smoking status: Former   Smokeless tobacco: Never   Tobacco comments:    29 pack per year history, quit several years ago  Vaping Use   Vaping Use: Never used  Substance Use Topics   Alcohol use: No    Comment: 1-2 drinks per month   Drug use: No    Comment: Remote marijuana in highschool- no injections    Review of Systems Per HPI unless specifically indicated above     Objective:    BP 128/79   Pulse 71   Ht 5\' 7"  (1.702 m)   Wt 241 lb 9.6 oz (109.6 kg)    LMP 09/23/2012   SpO2 96%   BMI 37.84 kg/m   Wt Readings from Last 3 Encounters:  05/12/21 241 lb 9.6 oz (109.6 kg)  11/04/20 234 lb (106.1 kg)  06/05/20 236 lb (107 kg)    Physical Exam Vitals and nursing note reviewed.  Constitutional:      General: She is not in acute distress.    Appearance: Normal appearance. She is well-developed. She is not diaphoretic.     Comments: Well-appearing, comfortable, cooperative  HENT:     Head: Normocephalic and atraumatic.  Eyes:     General:        Right eye: No discharge.        Left eye: No discharge.     Conjunctiva/sclera: Conjunctivae normal.  Cardiovascular:     Rate and Rhythm: Normal rate.  Pulmonary:     Effort: Pulmonary effort is normal.  Skin:    General: Skin is warm and dry.     Findings: No erythema or rash.  Neurological:     Mental Status: She is alert and oriented to person, place, and time.  Psychiatric:        Mood and Affect: Mood normal.        Behavior: Behavior normal.  Thought Content: Thought content normal.     Comments: Well groomed, good eye contact, normal speech and thoughts     Results for orders placed or performed in visit on 04/27/21  T4, free  Result Value Ref Range   Free T4 1.6 0.8 - 1.8 ng/dL  TSH  Result Value Ref Range   TSH 0.69 0.40 - 4.50 mIU/L      Assessment & Plan:   Problem List Items Addressed This Visit     Hypothyroidism - Primary    Controlled hypothyroidism on current dose levothyroxine Last TSH Free T4 still in normal range Weight gain Continue current Levothyroxine daily refilled  F/u lab 6 months       Relevant Medications   levothyroxine (SYNTHROID) 137 MCG tablet      Meds ordered this encounter  Medications   levothyroxine (SYNTHROID) 137 MCG tablet    Sig: Take 1 tablet (137 mcg total) by mouth daily before breakfast.    Dispense:  90 tablet    Refill:  3     Follow up plan: Return in about 6 months (around 11/09/2021) for 6 month  fasting lab only then 1 week later Annual Physical.  Future labs ordered for 10/2021 add TSH T4   Saralyn Pilar, DO [facility] 05/12/2021, 10:54 AM

## 2021-10-14 ENCOUNTER — Other Ambulatory Visit: Payer: Self-pay | Admitting: Family Medicine

## 2021-10-14 DIAGNOSIS — Z1231 Encounter for screening mammogram for malignant neoplasm of breast: Secondary | ICD-10-CM

## 2021-10-21 DIAGNOSIS — Z0100 Encounter for examination of eyes and vision without abnormal findings: Secondary | ICD-10-CM | POA: Diagnosis not present

## 2021-10-21 DIAGNOSIS — H2513 Age-related nuclear cataract, bilateral: Secondary | ICD-10-CM | POA: Diagnosis not present

## 2021-10-23 ENCOUNTER — Other Ambulatory Visit: Payer: Self-pay | Admitting: Family Medicine

## 2021-10-23 DIAGNOSIS — K219 Gastro-esophageal reflux disease without esophagitis: Secondary | ICD-10-CM

## 2021-10-25 NOTE — Telephone Encounter (Signed)
Requested Prescriptions  ?Pending Prescriptions Disp Refills  ?? omeprazole (PRILOSEC) 20 MG capsule [Pharmacy Med Name: OMEPRAZOLE DR 20 MG CAPSULE] 90 capsule 1  ?  Sig: TAKE 1 CAPSULE BY MOUTH EVERY DAY BEFORE BREAKFAST  ?  ? Gastroenterology: Proton Pump Inhibitors Passed - 10/23/2021 12:14 PM  ?  ?  Passed - Valid encounter within last 12 months  ?  Recent Outpatient Visits   ?      ? 5 months ago Hypothyroidism due to acquired atrophy of thyroid  ? [facility] Smitty Cords, DO  ? 11 months ago Annual physical exam  ? [facility] [place], Netta Neat, DO  ? 1 year ago Hypothyroidism due to acquired atrophy of thyroid  ? [facility] Althea Charon, Netta Neat, DO  ? 1 year ago Annual physical exam  ? [facility] Althea Charon Netta Neat, DO  ? 2 years ago Gross hematuria  ? [facility] Kyung Rudd, Alison Stalling, NP  ?  ?  ?Future Appointments   ?        ? In 3 weeks Althea Charon Netta Neat, DO [facility]  ?  ? ?  ?  ?  ? ?

## 2021-11-04 DIAGNOSIS — H2511 Age-related nuclear cataract, right eye: Secondary | ICD-10-CM | POA: Diagnosis not present

## 2021-11-10 ENCOUNTER — Other Ambulatory Visit: Payer: BC Managed Care – PPO

## 2021-11-11 ENCOUNTER — Other Ambulatory Visit: Payer: BC Managed Care – PPO

## 2021-11-11 ENCOUNTER — Other Ambulatory Visit: Payer: Self-pay

## 2021-11-11 ENCOUNTER — Encounter: Payer: Self-pay | Admitting: Advanced Practice Midwife

## 2021-11-11 ENCOUNTER — Ambulatory Visit: Payer: BC Managed Care – PPO | Admitting: Advanced Practice Midwife

## 2021-11-11 ENCOUNTER — Other Ambulatory Visit (HOSPITAL_COMMUNITY)
Admission: RE | Admit: 2021-11-11 | Discharge: 2021-11-11 | Disposition: A | Discharge status: Home / Self Care | Payer: BC Managed Care – PPO | Source: Ambulatory Visit | Attending: Advanced Practice Midwife | Admitting: Advanced Practice Midwife

## 2021-11-11 VITALS — BP 153/96 | HR 83 | Wt 245.0 lb

## 2021-11-11 DIAGNOSIS — R030 Elevated blood-pressure reading, without diagnosis of hypertension: Secondary | ICD-10-CM

## 2021-11-11 DIAGNOSIS — E034 Atrophy of thyroid (acquired): Secondary | ICD-10-CM

## 2021-11-11 DIAGNOSIS — Z01419 Encounter for gynecological examination (general) (routine) without abnormal findings: Secondary | ICD-10-CM

## 2021-11-11 DIAGNOSIS — E7800 Pure hypercholesterolemia, unspecified: Secondary | ICD-10-CM

## 2021-11-11 DIAGNOSIS — Z780 Asymptomatic menopausal state: Secondary | ICD-10-CM

## 2021-11-11 DIAGNOSIS — R7303 Prediabetes: Secondary | ICD-10-CM | POA: Diagnosis not present

## 2021-11-11 DIAGNOSIS — N951 Menopausal and female climacteric states: Secondary | ICD-10-CM

## 2021-11-11 DIAGNOSIS — Z0000 Encounter for general adult medical examination without abnormal findings: Secondary | ICD-10-CM

## 2021-11-11 MED ORDER — ESTROGENS CONJUGATED 0.625 MG/GM VA CREA: 1.0000 | TOPICAL_CREAM | VAGINAL | 0 refills | Status: AC

## 2021-11-12 LAB — CBC WITH DIFFERENTIAL/PLATELET
Absolute Monocytes: 534 cells/uL (ref 200–950)
Basophils Absolute: 41 cells/uL (ref 0–200)
Basophils Relative: 0.7 %
Eosinophils Absolute: 284 cells/uL (ref 15–500)
Eosinophils Relative: 4.9 %
HCT: 45.3 % — ABNORMAL HIGH (ref 35.0–45.0)
Hemoglobin: 15.3 g/dL (ref 11.7–15.5)
Lymphs Abs: 1781 cells/uL (ref 850–3900)
MCH: 30.4 pg (ref 27.0–33.0)
MCHC: 33.8 g/dL (ref 32.0–36.0)
MCV: 89.9 fL (ref 80.0–100.0)
MPV: 10.8 fL (ref 7.5–12.5)
Monocytes Relative: 9.2 %
Neutro Abs: 3161 cells/uL (ref 1500–7800)
Neutrophils Relative %: 54.5 %
Platelets: 219 10*3/uL (ref 140–400)
RBC: 5.04 10*6/uL (ref 3.80–5.10)
RDW: 13.1 % (ref 11.0–15.0)
Total Lymphocyte: 30.7 %
WBC: 5.8 10*3/uL (ref 3.8–10.8)

## 2021-11-12 LAB — LIPID PANEL
Cholesterol: 166 mg/dL (ref <200)
HDL: 49 mg/dL — ABNORMAL LOW (ref >=50)
LDL Cholesterol (Calc): 95 mg/dL (calc)
Non-HDL Cholesterol (Calc): 117 mg/dL (calc) (ref <130)
Total CHOL/HDL Ratio: 3.4 (calc) (ref <5.0)
Triglycerides: 121 mg/dL (ref <150)

## 2021-11-12 LAB — COMPLETE METABOLIC PANEL WITH GFR
AG Ratio: 1.6 (calc) (ref 1.0–2.5)
ALT: 10 U/L (ref 6–29)
AST: 11 U/L (ref 10–35)
Albumin: 3.8 g/dL (ref 3.6–5.1)
Alkaline phosphatase (APISO): 75 U/L (ref 37–153)
BUN: 12 mg/dL (ref 7–25)
CO2: 28 mmol/L (ref 20–32)
Calcium: 8.7 mg/dL (ref 8.6–10.4)
Chloride: 106 mmol/L (ref 98–110)
Creat: 0.82 mg/dL (ref 0.50–1.05)
Globulin: 2.4 g/dL (calc) (ref 1.9–3.7)
Glucose, Bld: 98 mg/dL (ref 65–99)
Potassium: 4.4 mmol/L (ref 3.5–5.3)
Sodium: 141 mmol/L (ref 135–146)
Total Bilirubin: 0.4 mg/dL (ref 0.2–1.2)
Total Protein: 6.2 g/dL (ref 6.1–8.1)
eGFR: 82 mL/min/{1.73_m2} (ref >=60)

## 2021-11-12 LAB — T4, FREE: Free T4: 1.5 ng/dL (ref 0.8–1.8)

## 2021-11-12 LAB — TSH: TSH: 1.11 mIU/L (ref 0.40–4.50)

## 2021-11-12 LAB — HEMOGLOBIN A1C
Hgb A1c MFr Bld: 5.5 % of total Hgb (ref <5.7)
Mean Plasma Glucose: 111 mg/dL
eAG (mmol/L): 6.2 mmol/L

## 2021-11-12 NOTE — Progress Notes (Signed)
? ? ?GYNECOLOGY ANNUAL PREVENTATIVE CARE ENCOUNTER NOTE ? ?History:    ? Cindy Mccoy is a 61 y.o. No obstetric history on file. female here for a routine annual gynecologic exam.  Current complaints: vaginal dryness impacting sexual health, recurrent problem. Patient has tried OTC KY jelly without success. She requests a hormonal cream.   Denies abnormal vaginal bleeding, discharge, pelvic pain, problems with intercourse or other gynecologic concerns.  ?  ?Gynecologic History ?Patient's last menstrual period was 09/23/2012. ?Contraception: post menopausal status (menopause age 28) ?Last Pap: 10/2015. Result was normal with negative HPV ?Last Mammogram: 10/2020.  Result was normal ?Last Colonoscopy: Normal per patient but description sounds like cologuard or similar product.  Result was normal ? ?Obstetric History ?OB History  ?No obstetric history on file.  ? ? ?Past Medical History:  ?Diagnosis Date  ? Anxiety   ? Breast mass, left   ? Collapsed lung 2005  ? Dermatitis   ? contact  ? Family history of malignant neoplasm of breast   ? History of chicken pox   ? Pure hypercholesterolemia   ? Unspecified menopausal and postmenopausal disorder   ? ? ?Past Surgical History:  ?Procedure Laterality Date  ? BREAST BIOPSY  1981  ? BREAST BIOPSY Left   ? ? ?Current Outpatient Medications on File Prior to Visit  ?Medication Sig Dispense Refill  ? levothyroxine (SYNTHROID) 137 MCG tablet Take 1 tablet (137 mcg total) by mouth daily before breakfast. 90 tablet 3  ? Multiple Vitamin (MULTIVITAMIN) tablet Take 1 tablet by mouth daily.    ? omeprazole (PRILOSEC) 20 MG capsule TAKE 1 CAPSULE BY MOUTH EVERY DAY BEFORE BREAKFAST 90 capsule 1  ? ?No current facility-administered medications on file prior to visit.  ? ? ?Not on File ? ?Social History:  reports that she has quit smoking. She has never used smokeless tobacco. She reports that she does not drink alcohol and does not use drugs. ? ?Family History  ?Problem Relation Age  of Onset  ? Cancer Mother   ?     breast  ? COPD Mother   ? Breast cancer Mother   ? Heart disease Father   ?     CHF  ? ? ?The following portions of the patient's history were reviewed and updated as appropriate: allergies, current medications, past family history, past medical history, past social history, past surgical history and problem list. ? ?Review of Systems ?Pertinent items noted in HPI and remainder of comprehensive ROS otherwise negative. ? ?Physical Exam:  ?BP (!) 153/96   Pulse 83   Wt 245 lb (111.1 kg)   LMP 09/23/2012   BMI 38.37 kg/m?  ?CONSTITUTIONAL: Well-developed, well-nourished female in no acute distress.  ?HENT:  Normocephalic, atraumatic, External right and left ear normal.  ?EYES: Conjunctivae and EOM are normal. Pupils are equal, round, and reactive to light. No scleral icterus.  ?SKIN: Skin is warm and dry. No rash noted. Not diaphoretic. No erythema. No pallor. ?MUSCULOSKELETAL: Normal range of motion. No tenderness.  No cyanosis, clubbing, or edema. ?NEUROLOGIC: Alert and oriented to person, place, and time. Normal reflexes, muscle tone coordination.  ?PSYCHIATRIC: Normal mood and affect. Normal behavior. Normal judgment and thought content. ?CARDIOVASCULAR: Normal heart rate noted, regular rhythm ?RESPIRATORY: Clear to auscultation bilaterally. Effort and breath sounds normal, no problems with respiration noted. ?BREASTS: Symmetric in size. No masses, tenderness, skin changes, nipple drainage, or lymphadenopathy bilaterally. Performed in the presence of a chaperone. ?ABDOMEN: Soft, no distention noted.  No  tenderness, rebound or guarding.  ?PELVIC: Normal appearing external genitalia and urethral meatus; vaginal mucosa consistent with postmenopausal status Normal appearing cervix.  No abnormal vaginal discharge noted.  Pap smear obtained.  Normal uterine size, no other palpable masses, no uterine or adnexal tenderness.  Performed in the presence of a chaperone. ?  ?Assessment and  Plan:  ?  1. Well woman exam with routine gynecological exam ?- No abnormal findings on physical exam ?- Cytology - PAP ? ?2. Postmenopausal ? ? ?3. Vaginal dryness, menopausal ?- Rx coordinated  with Dr. Roselie Awkward ?- conjugated estrogens (PREMARIN) vaginal cream; Administer 1 g vaginally 2 x per week for 8 weeks  Dispense: 32 g; Refill: 0 ? ? ?Will follow up results of pap smear and manage accordingly. ?Mammogram coordinated by PCP ?Colon cancer screening is up to date ?Routine preventative health maintenance measures emphasized. ?Please refer to After Visit Summary for other counseling recommendations.  ?   ?Total visit time: 60 minutes. Greater than 50% of visit spent in counseling and coordination of care ? ?Mallie Snooks, MSA, MSN, CNM ?Certified Nurse Midwife, [place] [facility] ? ? ?

## 2021-11-15 ENCOUNTER — Ambulatory Visit
Admission: RE | Admit: 2021-11-15 | Discharge: 2021-11-15 | Disposition: A | Discharge status: Home / Self Care | Payer: BC Managed Care – PPO | Source: Ambulatory Visit | Attending: Family Medicine | Admitting: Family Medicine

## 2021-11-15 DIAGNOSIS — Z1231 Encounter for screening mammogram for malignant neoplasm of breast: Secondary | ICD-10-CM

## 2021-11-16 ENCOUNTER — Ambulatory Visit: Payer: Self-pay

## 2021-11-16 LAB — CYTOLOGY - PAP
Adequacy: ABSENT
Chlamydia: NEGATIVE
Comment: NEGATIVE
Comment: NEGATIVE
Comment: NEGATIVE
Comment: NORMAL
Diagnosis: NEGATIVE
High risk HPV: NEGATIVE
Neisseria Gonorrhea: NEGATIVE
Trichomonas: NEGATIVE

## 2021-11-16 NOTE — Telephone Encounter (Signed)
?  Chief Complaint: COVID positive ?Symptoms: headache, cough, congestion, body aches ?Frequency: started last night ?Pertinent Negatives: Patient denies fever ?Disposition: [] ED /[] Urgent Care (no appt availability in office) / [x] Appointment(In office/virtual)/ []  [facility]/ [] Home Care/ [] Refused Recommended Disposition /[] [facility]/ []  Follow-up with PCP ?Additional Notes: Pt states she is taking Ibuprofen for headache and helps at times. She had annual visit scheduled for tomorrow but call and spoke with Rachell, [place] who was able to change appt to tele visit for COVID.  ? ? ?Summary: husband wife positive COVID want covid meds  ? Pt has tested positive for covid and has a CPE appt for 3/29 which I have not cancelled yet as requested due to Dr Marthann Schiller lack of availability.Her husband also has tested positive on Sunday as he got from a co-worker. They both want the covid meds. Called office but no answer. Wanted to make CPE slot virtual maybe could use that slot for the both, they know Dr Raliegh Ip well. Her husband Malayasia, [place] MRN [id number]  ?They want a cb at [phone]. Note again open appt for her CPE for tomorrow! Call [facility] [facility] at [phone]   ?  ? ?Reason for Disposition ? [1] COVID-19 diagnosed by positive lab test (e.g., PCR, rapid self-test kit) AND [2] mild symptoms (e.g., cough, fever, others) AND [6] no complications or SOB ? ?Answer Assessment - Initial Assessment Questions ?1. COVID-19 DIAGNOSIS: "Who made your COVID-19 diagnosis?" "Was it confirmed by a positive lab test or self-test?" If not diagnosed by a doctor (or NP/PA), ask "Are there lots of cases (community spread) where you live?" Note: See public health department website, if unsure. ?    Home test ?2. COVID-19 EXPOSURE: "Was there any known exposure to COVID before the symptoms began?" CDC Definition of close contact: within 6 feet (2 meters) for a total of 15 minutes or more over a 24-hour period.  ?    husband ?3.  ONSET: "When did the COVID-19 symptoms start?"  ?    Last night ?4. WORST SYMPTOM: "What is your worst symptom?" (e.g., cough, fever, shortness of breath, muscle aches) ?    headache ?5. COUGH: "Do you have a cough?" If Yes, ask: "How bad is the cough?"   ?    yes ?6. FEVER: "Do you have a fever?" If Yes, ask: "What is your temperature, how was it measured, and when did it start?" ?    No ?7. RESPIRATORY STATUS: "Describe your breathing?" (e.g., shortness of breath, wheezing, unable to speak)  ?    SOB ?9. HIGH RISK DISEASE: "Do you have any chronic medical problems?" (e.g., asthma, heart or lung disease, weak immune system, obesity, etc.) ?    No ?10. VACCINE: "Have you had the COVID-19 vaccine?" If Yes, ask: "Which one, how many shots, when did you get it?" ?      yes ?11. BOOSTER: "Have you received your COVID-19 booster?" If Yes, ask: "Which one and when did you get it?" ?      yes ?13. OTHER SYMPTOMS: "Do you have any other symptoms?"  (e.g., chills, fatigue, headache, loss of smell or taste, muscle pain, sore throat) ?      Body aches ? ?Protocols used: Coronavirus (VHQIO-96) Diagnosed or Suspected-A-AH ? ?

## 2021-11-17 ENCOUNTER — Encounter: Payer: Self-pay | Admitting: Family Medicine

## 2021-11-17 ENCOUNTER — Other Ambulatory Visit: Payer: Self-pay

## 2021-11-17 ENCOUNTER — Ambulatory Visit (INDEPENDENT_AMBULATORY_CARE_PROVIDER_SITE_OTHER): Payer: BC Managed Care – PPO | Admitting: Family Medicine

## 2021-11-17 VITALS — Wt 245.0 lb

## 2021-11-17 DIAGNOSIS — U071 COVID-19: Secondary | ICD-10-CM | POA: Diagnosis not present

## 2021-11-17 MED ORDER — NIRMATRELVIR/RITONAVIR (PAXLOVID)TABLET: 3.0000 | ORAL_TABLET | Freq: Two times a day (BID) | ORAL | 0 refills | Status: AC

## 2021-11-17 NOTE — Patient Instructions (Addendum)
Nirmatrelvir; Ritonavir Tablets ?What is this medication? ?NIRMATRELVIR; RITONAVIR (NIR ma TREL vir; ri TOE na veer) treats mild to moderate COVID-19. It may help people who are at high risk of developing severe illness. This medication works by limiting the spread of the virus in your body. The FDA has allowed the emergency use of this medication. ?This medicine may be used for other purposes; ask your health care provider or pharmacist if you have questions. ?COMMON BRAND NAME(S): PAXLOVID ?What should I tell my care team before I take this medication? ?They need to know if you have any of these conditions: ?Any allergies ?Any serious illness ?Kidney disease ?Liver disease ?An unusual or allergic reaction to nirmatrelvir, ritonavir, other medications, foods, dyes, or preservatives ?Pregnant or trying to get pregnant ?Breast-feeding ?How should I use this medication? ?This product contains 2 different medications that are packaged together. For the standard dose, take 2 pink tablets of nirmatrelvir with 1 white tablet of ritonavir (3 tablets total) by mouth with water twice daily. Talk to your care team if you have kidney disease. You may need a different dose. Swallow the tablets whole. You can take it with or without food. If it upsets your stomach, take it with food. Take all of this medication unless your care team tells you to stop it early. Keep taking it even if you think you are better. ?Talk to your care team about the use of this medication in children. While it may be prescribed for children as young as 12 years for selected conditions, precautions do apply. ?Overdosage: If you think you have taken too much of this medicine contact a poison control center or emergency room at once. ?NOTE: This medicine is only for you. Do not share this medicine with others. ?What if I miss a dose? ?If you miss a dose, take it as soon as you can unless it is more than 8 hours late. If it is more than 8 hours late, skip  the missed dose. Take the next dose at the normal time. Do not take extra or 2 doses at the same time to make up for the missed dose. ?What may interact with this medication? ?Do not take this medication with any of the following medications: ?Alfuzosin ?Certain medications for anxiety or sleep like midazolam, triazolam ?Certain medications for cancer like apalutamide, enzalutamide ?Certain medications for cholesterol like lovastatin, simvastatin ?Certain medications for irregular heart beat like amiodarone, dronedarone, flecainide, propafenone, quinidine ?Certain medications for pain like meperidine, piroxicam ?Certain medications for psychotic disorders like clozapine, lurasidone, pimozide ?Certain medications for seizures like carbamazepine, phenobarbital, phenytoin ?Colchicine ?Eletriptan ?Eplerenone ?Ergot alkaloids like dihydroergotamine, ergonovine, ergotamine, methylergonovine ?Finerenone ?Flibanserin ?Ivabradine ?Lomitapide ?Naloxegol ?Ranolazine ?Rifampin ?Sildenafil ?Silodosin ?St. John's Wort ?Tolvaptan ?Ubrogepant ?Voclosporin ?This medication may also interact with the following medications: ?Bedaquiline ?Birth control pills ?Bosentan ?Certain antibiotics like erythromycin or clarithromycin ?Certain medications for blood pressure like amlodipine, diltiazem, felodipine, nicardipine, nifedipine ?Certain medications for cancer like abemaciclib, ceritinib, dasatinib, encorafenib, ibrutinib, ivosidenib, neratinib, nilotinib, venetoclax, vinblastine, vincristine ?Certain medications for cholesterol like atorvastatin, rosuvastatin ?Certain medications for depression like bupropion, trazodone ?Certain medications for fungal infections like isavuconazonium, itraconazole, ketoconazole, voriconazole ?Certain medications for hepatitis C like elbasvir; grazoprevir, dasabuvir; ombitasvir; paritaprevir; ritonavir, glecaprevir; pibrentasvir, sofosbuvir; velpatasvir; voxilaprevir ?Certain medications for HIV or  AIDS ?Certain medications for irregular heartbeat like lidocaine ?Certain medications that treat or prevent blood clots like rivaroxaban, warfarin ?Digoxin ?Fentanyl ?Medications that lower your chance of fighting infection like cyclosporine, sirolimus, tacrolimus ?Methadone ?Quetiapine ?  Rifabutin ?Salmeterol ?Steroid medications like betamethasone, budesonide, ciclesonide, dexamethasone, fluticasone, methylprednisone, mometasone, triamcinolone ?This list may not describe all possible interactions. Give your health care provider a list of all the medicines, herbs, non-prescription drugs, or dietary supplements you use. Also tell them if you smoke, drink alcohol, or use illegal drugs. Some items may interact with your medicine. ?What should I watch for while using this medication? ?Your condition will be monitored carefully while you are receiving this medication. Visit your care team for regular checkups. Tell your care team if your symptoms do not start to get better or if they get worse. ?If you have untreated HIV infection, this medication may lead to some HIV medications not working as well in the future. ?Birth control may not work properly while you are taking this medication. Talk to your care team about using an extra method of birth control. ?What side effects may I notice from receiving this medication? ?Side effects that you should report to your care team as soon as possible: ?Allergic reactions--skin rash, itching, hives, swelling of the face, lips, tongue, or throat ?Liver injury--right upper belly pain, loss of appetite, nausea, light-colored stool, dark yellow or brown urine, yellowing skin or eyes, unusual weakness or fatigue ?Redness, blistering, peeling, or loosening of the skin, including inside the mouth ?Side effects that usually do not require medical attention (report these to your care team if they continue or are bothersome): ?Change in taste ?Diarrhea ?General discomfort and  fatigue ?Increase in blood pressure ?Muscle pain ?Nausea ?Stomach pain ?This list may not describe all possible side effects. Call your doctor for medical advice about side effects. You may report side effects to FDA at 1-800-FDA-1088. ?Where should I keep my medication? ?Keep out of the reach of children and pets. ?Store at room temperature between 20 and 25 degrees C (68 and 77 degrees F). Get rid of any unused medication after the expiration date. ?To get rid of medications that are no longer needed or have expired: ?Take the medication to a medication take-back program. Check with your pharmacy or law enforcement to find a location. ?If you cannot return the medication, check the label or package insert to see if the medication should be thrown out in the garbage or flushed down the toilet. If you are not sure, ask your care team. If it is safe to put it in the trash, take the medication out of the container. Mix the medication with cat litter, dirt, coffee grounds, or other unwanted substance. Seal the mixture in a bag or container. Put it in the trash. ?NOTE: This sheet is a summary. It may not cover all possible information. If you have questions about this medicine, talk to your doctor, pharmacist, or health care provider. ?? 2022 Elsevier/Gold Standard (2021-05-10 00:00:00) ? ? ?Please schedule a Follow-up Appointment to: Return if symptoms worsen or fail to improve. ? ?If you have any other questions or concerns, please feel free to call the office or send a message through MyChart. You may also schedule an earlier appointment if necessary. ? ?Additionally, you may be receiving a survey about your experience at our office within a few days to 1 week by e-mail or mail. We value your feedback. ? ?Saralyn Pilar, DO ?[facility], [place] ?

## 2021-11-17 NOTE — Progress Notes (Signed)
Virtual Visit via Telephone ?The purpose of this virtual visit is to provide medical care while limiting exposure to the novel coronavirus (COVID19) for both patient and office staff. ? ?Consent was obtained for phone visit:  Yes.   ?Answered questions that patient had about telehealth interaction:  Yes.   ?I discussed the limitations, risks, security and privacy concerns of performing an evaluation and management service by telephone. I also discussed with the patient that there may be a patient responsible charge related to this service. The patient expressed understanding and agreed to proceed. ? ?Patient Location: Home ?Provider Location: [facility] (Office) ? ?Participants in virtual visit: ?- Patient: Cindy Mccoy ?- CMA: Orinda Kenner, CMA ?- Provider: Dr Parks Ranger ? ?---------------------------------------------------------------------- ?Chief Complaint  ?Patient presents with  ? Covid Positive  ? ? ?S: Reviewed CMA documentation. I have called patient and gathered additional HPI as follows: ? ?COVID19 Positive ?Reports that symptoms started 36-48 hours ago, husband also sick with covid positive, sick contact w covid at work. She had initially headache then worsening with cough. ?- Tried OTC Advil PRN for headache, NyQuil ?Exposure to [place] ? ?Admits mild nausea, and reduced appetite ? ?Denies any fevers, chills, sweats, body ache, shortness of breath, sinus pain or pressure, abdominal pain, diarrhea ? ?Past Medical History:  ?Diagnosis Date  ? Anxiety   ? Breast mass, left   ? Collapsed lung 2005  ? Dermatitis   ? contact  ? Family history of malignant neoplasm of breast   ? History of chicken pox   ? Pure hypercholesterolemia   ? Unspecified menopausal and postmenopausal disorder   ? ?Social History  ? ?Tobacco Use  ? Smoking status: Former  ? Smokeless tobacco: Never  ? Tobacco comments:  ?  29 pack per year history, quit several years ago  ?Vaping Use  ? Vaping Use: Never  used  ?Substance Use Topics  ? Alcohol use: No  ?  Comment: 1-2 drinks per month  ? Drug use: No  ?  Comment: Remote marijuana in highschool- no injections  ? ? ?Current Outpatient Medications:  ?  conjugated estrogens (PREMARIN) vaginal cream, Place 1 Applicatorful vaginally 2 (two) times a week for 16 doses. Administer 1 g vaginally 2 x per week for 8 weeks, Disp: 32 g, Rfl: 0 ?  levothyroxine (SYNTHROID) 137 MCG tablet, Take 1 tablet (137 mcg total) by mouth daily before breakfast., Disp: 90 tablet, Rfl: 3 ?  Multiple Vitamin (MULTIVITAMIN) tablet, Take 1 tablet by mouth daily., Disp: , Rfl:  ?  omeprazole (PRILOSEC) 20 MG capsule, TAKE 1 CAPSULE BY MOUTH EVERY DAY BEFORE BREAKFAST, Disp: 90 capsule, Rfl: 1 ?  nirmatrelvir/ritonavir EUA (PAXLOVID) 20 x 150 MG & 10 x $Re'100MG'MCm$  TABS, Take 3 tablets by mouth 2 (two) times daily for 5 days. (Take nirmatrelvir 150 mg two tablets twice daily for 5 days and ritonavir 100 mg one tablet twice daily for 5 days) Patient GFR is >60, Disp: 30 tablet, Rfl: 0 ? ? ?  11/17/2021  ? 10:45 AM 05/12/2021  ? 10:43 AM 06/05/2020  ?  8:40 AM  ?Depression screen PHQ 2/9  ?Decreased Interest 0 0 0  ?Down, Depressed, Hopeless 0 0 0  ?PHQ - 2 Score 0 0 0  ?Altered sleeping 0 0   ?Tired, decreased energy 0 0   ?Change in appetite 0 0   ?Feeling bad or failure about yourself  0 0   ?Trouble concentrating 0 0   ?  Moving slowly or fidgety/restless 0 0   ?Suicidal thoughts 0 0   ?PHQ-9 Score 0 0   ?Difficult doing work/chores Not difficult at all Not difficult at all   ? ? ? ?  11/17/2021  ? 10:45 AM 05/12/2021  ? 10:43 AM  ?GAD 7 : Generalized Anxiety Score  ?Nervous, Anxious, on Edge 0 0  ?Control/stop worrying 0 0  ?Worry too much - different things 0 0  ?Trouble relaxing 0 0  ?Restless 0 0  ?Easily annoyed or irritable 0 0  ?Afraid - awful might happen 0 0  ?Total GAD 7 Score 0 0  ?Anxiety Difficulty Not difficult at all Not difficult at all   ? ? ?-------------------------------------------------------------------------- ?O: No physical exam performed due to remote telephone encounter. ? ?Lab results reviewed. ? ?Recent Results (from the past 2160 hour(s))  ?T4, free     Status: None  ? Collection Time: 11/11/21  8:31 AM  ?Result Value Ref Range  ? Free T4 1.5 0.8 - 1.8 ng/dL  ?TSH     Status: None  ? Collection Time: 11/11/21  8:31 AM  ?Result Value Ref Range  ? TSH 1.11 0.40 - 4.50 mIU/L  ?Hemoglobin A1c     Status: None  ? Collection Time: 11/11/21  8:31 AM  ?Result Value Ref Range  ? Hgb A1c MFr Bld 5.5 <5.7 % of total Hgb  ?  Comment: For the purpose of screening for the presence of ?diabetes: ?. ?<5.7%       Consistent with the absence of diabetes ?5.7-6.4%    Consistent with increased risk for diabetes ?            (prediabetes) ?> or =6.5%  Consistent with diabetes ?Marland Kitchen ?This assay result is consistent with a decreased risk ?of diabetes. ?. ?Currently, no consensus exists regarding use of ?hemoglobin A1c for diagnosis of diabetes in children. ?. ?According to American Diabetes Association (ADA) ?guidelines, hemoglobin A1c <7.0% represents optimal ?control in non-pregnant diabetic patients. Different ?metrics may apply to specific patient populations.  ?Standards of Medical Care in Diabetes(ADA). ?. ?  ? Mean Plasma Glucose 111 mg/dL  ? eAG (mmol/L) 6.2 mmol/L  ?Lipid panel     Status: Abnormal  ? Collection Time: 11/11/21  8:31 AM  ?Result Value Ref Range  ? Cholesterol 166 <200 mg/dL  ? HDL 49 (L) > OR = 50 mg/dL  ? Triglycerides 121 <150 mg/dL  ? LDL Cholesterol (Calc) 95 mg/dL (calc)  ?  Comment: Reference range: <100 ?Marland Kitchen ?Desirable range <100 mg/dL for primary prevention;   ?<70 mg/dL for patients with CHD or diabetic patients  ?with > or = 2 CHD risk factors. ?. ?LDL-C is now calculated using the Martin-Hopkins  ?calculation, which is a validated novel method providing  ?better accuracy than the Friedewald equation in the  ?estimation of  LDL-C.  ?Cresenciano Genre et al. Annamaria Helling. 0962;836(62): 2061-2068  ?(http://education.[employer].com/faq/FAQ164) ?  ? Total CHOL/HDL Ratio 3.4 <5.0 (calc)  ? Non-HDL Cholesterol (Calc) 117 <130 mg/dL (calc)  ?  Comment: For patients with diabetes plus 1 major ASCVD risk  ?factor, treating to a non-HDL-C goal of <100 mg/dL  ?(LDL-C of <70 mg/dL) is considered a therapeutic  ?option. ?  ?CBC with Differential/Platelet     Status: Abnormal  ? Collection Time: 11/11/21  8:31 AM  ?Result Value Ref Range  ? WBC 5.8 3.8 - 10.8 Thousand/uL  ? RBC 5.04 3.80 - 5.10 Million/uL  ? Hemoglobin 15.3 11.7 - 15.5  g/dL  ? HCT 45.3 (H) 35.0 - 45.0 %  ? MCV 89.9 80.0 - 100.0 fL  ? MCH 30.4 27.0 - 33.0 pg  ? MCHC 33.8 32.0 - 36.0 g/dL  ? RDW 13.1 11.0 - 15.0 %  ? Platelets 219 140 - 400 Thousand/uL  ? MPV 10.8 7.5 - 12.5 fL  ? Neutro Abs 3,161 1,500 - 7,800 cells/uL  ? Lymphs Abs 1,781 850 - 3,900 cells/uL  ? Absolute Monocytes 534 200 - 950 cells/uL  ? Eosinophils Absolute 284 15 - 500 cells/uL  ? Basophils Absolute 41 0 - 200 cells/uL  ? Neutrophils Relative % 54.5 %  ? Total Lymphocyte 30.7 %  ? Monocytes Relative 9.2 %  ? Eosinophils Relative 4.9 %  ? Basophils Relative 0.7 %  ?COMPLETE METABOLIC PANEL WITH GFR     Status: None  ? Collection Time: 11/11/21  8:31 AM  ?Result Value Ref Range  ? Glucose, Bld 98 65 - 99 mg/dL  ?  Comment: . ?           Fasting reference interval ?. ?  ? BUN 12 7 - 25 mg/dL  ? Creat 0.82 0.50 - 1.05 mg/dL  ? eGFR 82 > OR = 60 mL/min/1.52m2  ?  Comment: The eGFR is based on the CKD-EPI 2021 equation. To calculate  ?the new eGFR from a previous Creatinine or Cystatin C ?result, go to https://www.kidney.org/professionals/ ?kdoqi/gfr%5Fcalculator ?  ? BUN/Creatinine Ratio NOT APPLICABLE 6 - 22 (calc)  ? Sodium 141 135 - 146 mmol/L  ? Potassium 4.4 3.5 - 5.3 mmol/L  ? Chloride 106 98 - 110 mmol/L  ? CO2 28 20 - 32 mmol/L  ? Calcium 8.7 8.6 - 10.4 mg/dL  ? Total Protein 6.2 6.1 - 8.1 g/dL  ? Albumin 3.8 3.6 - 5.1  g/dL  ? Globulin 2.4 1.9 - 3.7 g/dL (calc)  ? AG Ratio 1.6 1.0 - 2.5 (calc)  ? Total Bilirubin 0.4 0.2 - 1.2 mg/dL  ? Alkaline phosphatase (APISO) 75 37 - 153 U/L  ? AST 11 10 - 35 U/L  ? ALT 10 6 - 29 U/L  ?Cytology - PAP     Status: None  ? Collection Time: 03/23/

## 2021-11-24 ENCOUNTER — Ambulatory Visit: Admit: 2021-11-24 | Payer: BC Managed Care – PPO | Admitting: Ophthalmology

## 2021-11-24 SURGERY — PHACOEMULSIFICATION, CATARACT, WITH IOL INSERTION
Anesthesia: Topical | Laterality: Right

## 2021-12-08 ENCOUNTER — Ambulatory Visit: Admit: 2021-12-08 | Payer: BC Managed Care – PPO | Admitting: Ophthalmology

## 2021-12-08 SURGERY — PHACOEMULSIFICATION, CATARACT, WITH IOL INSERTION
Anesthesia: Topical | Laterality: Left

## 2022-02-07 ENCOUNTER — Encounter: Payer: Self-pay | Admitting: Ophthalmology

## 2022-02-17 ENCOUNTER — Other Ambulatory Visit: Payer: Self-pay

## 2022-02-17 ENCOUNTER — Encounter
Admission: RE | Disposition: A | Discharge status: Home / Self Care | Payer: Self-pay | Source: Ambulatory Visit | Attending: Ophthalmology

## 2022-02-17 ENCOUNTER — Ambulatory Visit
Admission: RE | Admit: 2022-02-17 | Discharge: 2022-02-17 | Disposition: A | Discharge status: Home / Self Care | Payer: BC Managed Care – PPO | Source: Ambulatory Visit | Attending: Ophthalmology | Admitting: Ophthalmology

## 2022-02-17 ENCOUNTER — Ambulatory Visit: Payer: BC Managed Care – PPO | Admitting: Anesthesiology

## 2022-02-17 ENCOUNTER — Encounter: Payer: Self-pay | Admitting: Ophthalmology

## 2022-02-17 DIAGNOSIS — H25811 Combined forms of age-related cataract, right eye: Secondary | ICD-10-CM | POA: Diagnosis not present

## 2022-02-17 DIAGNOSIS — E039 Hypothyroidism, unspecified: Secondary | ICD-10-CM | POA: Insufficient documentation

## 2022-02-17 DIAGNOSIS — K219 Gastro-esophageal reflux disease without esophagitis: Secondary | ICD-10-CM | POA: Insufficient documentation

## 2022-02-17 DIAGNOSIS — H2511 Age-related nuclear cataract, right eye: Secondary | ICD-10-CM | POA: Insufficient documentation

## 2022-02-17 DIAGNOSIS — Z87891 Personal history of nicotine dependence: Secondary | ICD-10-CM | POA: Diagnosis not present

## 2022-02-17 HISTORY — PX: CATARACT EXTRACTION W/PHACO: SHX586

## 2022-02-17 HISTORY — DX: Gastro-esophageal reflux disease without esophagitis: K21.9

## 2022-02-17 SURGERY — PHACOEMULSIFICATION, CATARACT, WITH IOL INSERTION
Anesthesia: Monitor Anesthesia Care | Site: Eye | Laterality: Right

## 2022-02-17 MED ORDER — SIGHTPATH DOSE#1 NA HYALUR & NA CHOND-NA HYALUR IO KIT
PACK | INTRAOCULAR | Status: DC | PRN
  Administered 2022-02-17: 1 via OPHTHALMIC

## 2022-02-17 MED ORDER — LIDOCAINE HCL (PF) 2 % IJ SOLN
INTRAOCULAR | Status: DC | PRN
  Administered 2022-02-17: 1 mL via INTRAOCULAR

## 2022-02-17 MED ORDER — FENTANYL CITRATE (PF) 100 MCG/2ML IJ SOLN
INTRAMUSCULAR | Status: DC | PRN
Start: 2022-02-17 — End: 2022-02-17
  Administered 2022-02-17 (×2): 50 ug via INTRAVENOUS

## 2022-02-17 MED ORDER — SIGHTPATH DOSE#1 BSS IO SOLN
INTRAOCULAR | Status: DC | PRN
  Administered 2022-02-17: 88 mL via OPHTHALMIC

## 2022-02-17 MED ORDER — MIDAZOLAM HCL 2 MG/2ML IJ SOLN
INTRAMUSCULAR | Status: DC | PRN
Start: 2022-02-17 — End: 2022-02-17
  Administered 2022-02-17: 2 mg via INTRAVENOUS

## 2022-02-17 MED ORDER — ARMC OPHTHALMIC DILATING DROPS: OPHTHALMIC | Status: DC | PRN

## 2022-02-17 MED ORDER — SIGHTPATH DOSE#1 BSS IO SOLN
INTRAOCULAR | Status: DC | PRN
  Administered 2022-02-17: 15 mL

## 2022-02-17 MED ORDER — TETRACAINE HCL 0.5 % OP SOLN
1.0000 [drp] | OPHTHALMIC | Status: DC | PRN
  Administered 2022-02-17 (×3): 1 [drp] via OPHTHALMIC

## 2022-02-17 MED ORDER — MOXIFLOXACIN HCL 0.5 % OP SOLN
OPHTHALMIC | Status: DC | PRN
  Administered 2022-02-17: 0.2 mL via OPHTHALMIC

## 2022-02-17 MED ORDER — BRIMONIDINE TARTRATE-TIMOLOL 0.2-0.5 % OP SOLN
OPHTHALMIC | Status: DC | PRN
  Administered 2022-02-17: 1 [drp] via OPHTHALMIC

## 2022-02-17 SURGICAL SUPPLY — 16 items
CATARACT SUITE SIGHTPATH (MISCELLANEOUS) ×2 IMPLANT
DISSECTOR HYDRO NUCLEUS 50X22 (MISCELLANEOUS) ×2 IMPLANT
DRSG TEGADERM 2-3/8X2-3/4 SM (GAUZE/BANDAGES/DRESSINGS) ×2 IMPLANT
FEE CATARACT SUITE SIGHTPATH (MISCELLANEOUS) ×1 IMPLANT
GLOVE SURG GAMMEX PI TX LF 7.5 (GLOVE) ×2 IMPLANT
GLOVE SURG SYN 8.5  E (GLOVE) ×1
GLOVE SURG SYN 8.5 E (GLOVE) ×1 IMPLANT
GLOVE SURG SYN 8.5 PF PI (GLOVE) ×1 IMPLANT
LENS IOL EYHANCE TRC 150 12.0 IMPLANT
LENS IOL TORIC DIU 150 12.0 ×2 IMPLANT
NDL FILTER BLUNT 18X1 1/2 (NEEDLE) ×1 IMPLANT
NEEDLE FILTER BLUNT 18X 1/2SAF (NEEDLE) ×1
NEEDLE FILTER BLUNT 18X1 1/2 (NEEDLE) ×1 IMPLANT
SYR 3ML LL SCALE MARK (SYRINGE) ×2 IMPLANT
SYR 5ML LL (SYRINGE) ×2 IMPLANT
WATER STERILE IRR 250ML POUR (IV SOLUTION) ×2 IMPLANT

## 2022-02-17 NOTE — Anesthesia Postprocedure Evaluation (Signed)
Anesthesia Post Note  Patient: Cindy Mccoy  Procedure(s) Performed: CATARACT EXTRACTION PHACO AND INTRAOCULAR LENS PLACEMENT (IOC) CRIGHT EYHANCE TORIC (Right: Eye)     Patient location during evaluation: PACU Anesthesia Type: MAC Level of consciousness: awake and alert Pain management: pain level controlled Vital Signs Assessment: post-procedure vital signs reviewed and stable Respiratory status: spontaneous breathing Cardiovascular status: blood pressure returned to baseline Postop Assessment: no apparent nausea or vomiting, adequate PO intake and no headache Anesthetic complications: no   No notable events documented.  Adele Barthel Aarav Burgett

## 2022-02-17 NOTE — Anesthesia Procedure Notes (Signed)
Procedure Name: MAC Date/Time: 02/17/2022 2:17 PM  Performed by: Mayme Genta, CRNAPre-anesthesia Checklist: Patient identified, Emergency Drugs available, Suction available, Timeout performed and Patient being monitored Patient Re-evaluated:Patient Re-evaluated prior to induction Oxygen Delivery Method: Nasal cannula Placement Confirmation: positive ETCO2

## 2022-02-17 NOTE — Op Note (Signed)
OPERATIVE NOTE  Cindy Mccoy [id number] 02/17/2022   PREOPERATIVE DIAGNOSIS: Nuclear sclerotic cataract right eye. H25.11   POSTOPERATIVE DIAGNOSIS: Nuclear sclerotic cataract right eye. H25.11   PROCEDURE:  Phacoemusification with Toric posterior chamber intraocular lens placement of the right eye   LENS: +12.00 D DIU150 Toric intraocular lens with 1.50 diopters of cylindrical power with axis orientation at 049 degrees.  SURGEON:  Julious Payer. Rolley Sims, MD   ANESTHESIA:  Topical with tetracaine drops, augmented with 1% preservative-free intracameral lidocaine.   COMPLICATIONS:  None.   DESCRIPTION OF PROCEDURE:  The patient was identified in the holding room and transported to the operating room and placed in the supine position under the operating microscope.  The right eye was identified as the operative eye, which was prepped and draped in the usual sterile ophthalmic fashion.   A 1 millimeter clear-corneal paracentesis was made superotemporally. Preservative-free 1% lidocaine mixed with 1:1,000 bisulfite-free aqueous solution of epinephrine was injected into the anterior chamber. The anterior chamber was then filled with Viscoat viscoelastic. A 2.4 millimeter keratome was used to make a clear-corneal incision inferotemporally. A curvilinear capsulorrhexis was made with a cystotome and capsulorrhexis forceps. Balanced salt solution was used to hydrodissect and hydrodelineate the nucleus. Phacoemulsification was then used to remove the lens nucleus and epinucleus. The remaining cortex was then removed using the irrigation and aspiration handpiece. Provisc was then placed into the capsular bag to distend it for lens placement. The Verion digital marker was used to align the implant at the intended axis.  A +12.00 D DIU150  Toric lens was then injected into the capsular bag.  It was rotated clockwise until the axis marks on the lens were approximately 15 degrees in the counterclockwise  direction to the intended alignment.  The viscoelastic was aspirated from the eye using the irrigation aspiration handpiece.  Then, a blunt chopper through the sideport incision was used to rotate the lens in a clockwise direction until the axis markings of the intraocular lens were lined up with the Verion alignment at 49 degrees.  Balanced salt solution was then used to hydrate the wounds.   The anterior chamber was inflated to a physiologic pressure with balanced salt solution.  No wound leaks were noted. Vigamox was injected intracamerally.  Timolol and Brimonidine drops were applied to the eye.  The patient was taken to the recovery room in stable condition without complications of anesthesia or surgery.  Cindy Mccoy 02/17/2022, 2:52 PM

## 2022-02-17 NOTE — Anesthesia Preprocedure Evaluation (Signed)
Anesthesia Evaluation  Patient identified by MRN, date of birth, ID band Patient awake    History of Anesthesia Complications Negative for: history of anesthetic complications  Airway Mallampati: II  TM Distance: >3 FB Neck ROM: Full    Dental no notable dental hx.    Pulmonary former smoker,  Pneumothorax 2/2 bleb in the setting of heaving smoking. Quit many years ago    Pulmonary exam normal        Cardiovascular Exercise Tolerance: Good negative cardio ROS Normal cardiovascular exam     Neuro/Psych negative neurological ROS     GI/Hepatic Neg liver ROS, GERD  Medicated and Controlled,  Endo/Other  Hypothyroidism BMI 38  Renal/GU negative Renal ROS     Musculoskeletal   Abdominal   Peds  Hematology negative hematology ROS (+)   Anesthesia Other Findings   Reproductive/Obstetrics                             Anesthesia Physical Anesthesia Plan  ASA: 2  Anesthesia Plan: MAC   Post-op Pain Management: Minimal or no pain anticipated   Induction: Intravenous  PONV Risk Score and Plan: 2 and TIVA, Midazolam and Treatment may vary due to age or medical condition  Airway Management Planned: Nasal Cannula and Natural Airway  Additional Equipment: None  Intra-op Plan:   Post-operative Plan:   Informed Consent: I have reviewed the patients History and Physical, chart, labs and discussed the procedure including the risks, benefits and alternatives for the proposed anesthesia with the patient or authorized representative who has indicated his/her understanding and acceptance.       Plan Discussed with: CRNA  Anesthesia Plan Comments:         Anesthesia Quick Evaluation

## 2022-02-17 NOTE — H&P (Signed)
[facility]   Primary Care Physician:  Smitty Cords, DO Ophthalmologist: Dr. Deberah Pelton  Pre-Procedure History & Physical: HPI:  Cindy Mccoy is a 61 y.o. female here for cataract surgery.   Past Medical History:  Diagnosis Date   Anxiety    Breast mass, left    Collapsed lung 08/23/2003   Dermatitis    contact   Family history of malignant neoplasm of breast    GERD (gastroesophageal reflux disease)    History of chicken pox    Pure hypercholesterolemia    Unspecified menopausal and postmenopausal disorder     Past Surgical History:  Procedure Laterality Date   BREAST BIOPSY  1981   BREAST BIOPSY Left 10/13/2014    Prior to Admission medications   Medication Sig Start Date End Date Taking? Authorizing Provider  levothyroxine (SYNTHROID) 137 MCG tablet Take 1 tablet (137 mcg total) by mouth daily before breakfast. 05/12/21  Yes Karamalegos, Netta Neat, DO  Multiple Vitamin (MULTIVITAMIN) tablet Take 1 tablet by mouth daily.   Yes [provider]  omeprazole (PRILOSEC) 20 MG capsule TAKE 1 CAPSULE BY MOUTH EVERY DAY BEFORE BREAKFAST 10/25/21  Yes Karamalegos, Netta Neat, DO    Allergies as of 12/20/2021   (Not on File)    Family History  Problem Relation Age of Onset   COPD Mother    Breast cancer Mother    Heart disease Father        CHF    Social History   Socioeconomic History   Marital status: Married    Spouse name: Not on file   Number of children: Not on file   Years of education: Not on file   Highest education level: Not on file  Occupational History   Occupation: CNA  Tobacco Use   Smoking status: Former    Types: Cigarettes    Quit date: 2005    Years since quitting: 18.5   Smokeless tobacco: Never   Tobacco comments:    29 pack per year history, quit several years ago  Vaping Use   Vaping Use: Never used  Substance and Sexual Activity   Alcohol use: No    Comment: 1-2 drinks per month   Drug use: No     Comment: Remote marijuana in highschool- no injections   Sexual activity: Not on file  Other Topics Concern   Not on file  Social History Narrative   Not on file   Social Determinants of Health   Financial Resource Strain: Not on file  Food Insecurity: Not on file  Transportation Needs: Not on file  Physical Activity: Not on file  Stress: Not on file  Social Connections: Not on file  Intimate Partner Violence: Not on file    Review of Systems: See HPI, otherwise negative ROS  Physical Exam: BP (!) 167/89   Pulse 70   Temp 97.7 F (36.5 C) (Temporal)   Ht 5\' 7"  (1.702 m)   Wt 109.8 kg   LMP 09/23/2012   SpO2 97%   BMI 37.90 kg/m  General:   Alert, cooperative in NAD Head:  Normocephalic and atraumatic. Respiratory:  Normal work of breathing. Cardiovascular:  RRR  Impression/Plan: Cindy Mccoy is here for cataract surgery.  Risks, benefits, limitations, and alternatives regarding cataract surgery have been reviewed with the patient.  Questions have been answered.  All parties agreeable.   Lenell Antu, MD  02/17/2022, 1:05 PM

## 2022-02-17 NOTE — Transfer of Care (Signed)
Immediate Anesthesia Transfer of Care Note  Patient: Cindy Mccoy  Procedure(s) Performed: CATARACT EXTRACTION PHACO AND INTRAOCULAR LENS PLACEMENT (IOC) CRIGHT EYHANCE TORIC (Right: Eye)  Patient Location: PACU  Anesthesia Type: MAC  Level of Consciousness: awake, alert  and patient cooperative  Airway and Oxygen Therapy: Patient Spontanous Breathing and Patient connected to supplemental oxygen  Post-op Assessment: Post-op Vital signs reviewed, Patient's Cardiovascular Status Stable, Respiratory Function Stable, Patent Airway and No signs of Nausea or vomiting  Post-op Vital Signs: Reviewed and stable  Complications: No notable events documented.

## 2022-02-18 ENCOUNTER — Other Ambulatory Visit: Payer: Self-pay

## 2022-02-18 ENCOUNTER — Encounter: Payer: Self-pay | Admitting: Ophthalmology

## 2022-02-21 ENCOUNTER — Encounter: Payer: Self-pay | Admitting: Ophthalmology

## 2022-03-01 DIAGNOSIS — H2512 Age-related nuclear cataract, left eye: Secondary | ICD-10-CM | POA: Diagnosis not present

## 2022-03-01 NOTE — Discharge Instructions (Signed)

## 2022-03-03 ENCOUNTER — Ambulatory Visit: Payer: BC Managed Care – PPO | Admitting: Anesthesiology

## 2022-03-03 ENCOUNTER — Encounter: Payer: Self-pay | Admitting: Ophthalmology

## 2022-03-03 ENCOUNTER — Encounter
Admission: RE | Disposition: A | Discharge status: Home / Self Care | Payer: Self-pay | Source: Ambulatory Visit | Attending: Ophthalmology

## 2022-03-03 ENCOUNTER — Ambulatory Visit
Admission: RE | Admit: 2022-03-03 | Discharge: 2022-03-03 | Disposition: A | Discharge status: Home / Self Care | Payer: BC Managed Care – PPO | Source: Ambulatory Visit | Attending: Ophthalmology | Admitting: Ophthalmology

## 2022-03-03 ENCOUNTER — Other Ambulatory Visit: Payer: Self-pay

## 2022-03-03 DIAGNOSIS — Z87891 Personal history of nicotine dependence: Secondary | ICD-10-CM | POA: Diagnosis not present

## 2022-03-03 DIAGNOSIS — K219 Gastro-esophageal reflux disease without esophagitis: Secondary | ICD-10-CM | POA: Diagnosis not present

## 2022-03-03 DIAGNOSIS — E669 Obesity, unspecified: Secondary | ICD-10-CM | POA: Diagnosis not present

## 2022-03-03 DIAGNOSIS — T8522XA Displacement of intraocular lens, initial encounter: Secondary | ICD-10-CM | POA: Diagnosis not present

## 2022-03-03 DIAGNOSIS — H2512 Age-related nuclear cataract, left eye: Secondary | ICD-10-CM | POA: Diagnosis not present

## 2022-03-03 DIAGNOSIS — Z6838 Body mass index (BMI) 38.0-38.9, adult: Secondary | ICD-10-CM | POA: Diagnosis not present

## 2022-03-03 DIAGNOSIS — F419 Anxiety disorder, unspecified: Secondary | ICD-10-CM | POA: Insufficient documentation

## 2022-03-03 DIAGNOSIS — E039 Hypothyroidism, unspecified: Secondary | ICD-10-CM | POA: Diagnosis not present

## 2022-03-03 HISTORY — PX: CATARACT EXTRACTION W/PHACO: SHX586

## 2022-03-03 SURGERY — PHACOEMULSIFICATION, CATARACT, WITH IOL INSERTION
Anesthesia: Monitor Anesthesia Care | Site: Eye | Laterality: Left

## 2022-03-03 MED ORDER — MIDAZOLAM HCL 2 MG/2ML IJ SOLN
INTRAMUSCULAR | Status: DC | PRN
  Administered 2022-03-03 (×2): 1 mg via INTRAVENOUS

## 2022-03-03 MED ORDER — TETRACAINE HCL 0.5 % OP SOLN
1.0000 [drp] | OPHTHALMIC | Status: DC | PRN
  Administered 2022-03-03 (×3): 1 [drp] via OPHTHALMIC

## 2022-03-03 MED ORDER — SIGHTPATH DOSE#1 BSS IO SOLN
INTRAOCULAR | Status: DC | PRN
  Administered 2022-03-03: 83 mL via OPHTHALMIC

## 2022-03-03 MED ORDER — BRIMONIDINE TARTRATE-TIMOLOL 0.2-0.5 % OP SOLN
OPHTHALMIC | Status: DC | PRN
  Administered 2022-03-03: 1 [drp] via OPHTHALMIC

## 2022-03-03 MED ORDER — SIGHTPATH DOSE#1 BSS IO SOLN
INTRAOCULAR | Status: DC | PRN
  Administered 2022-03-03: 15 mL

## 2022-03-03 MED ORDER — ARMC OPHTHALMIC DILATING DROPS: OPHTHALMIC | Status: DC | PRN

## 2022-03-03 MED ORDER — TETRACAINE 0.5 % OP SOLN OPTIME - NO CHARGE
OPHTHALMIC | Status: DC | PRN
  Administered 2022-03-03: 2 [drp] via OPHTHALMIC

## 2022-03-03 MED ORDER — FENTANYL CITRATE (PF) 100 MCG/2ML IJ SOLN
INTRAMUSCULAR | Status: DC | PRN
  Administered 2022-03-03 (×2): 50 ug via INTRAVENOUS

## 2022-03-03 MED ORDER — MOXIFLOXACIN HCL 0.5 % OP SOLN
OPHTHALMIC | Status: DC | PRN
  Administered 2022-03-03: 0.2 mL via OPHTHALMIC

## 2022-03-03 MED ORDER — LIDOCAINE HCL (PF) 2 % IJ SOLN
INTRAOCULAR | Status: DC | PRN
  Administered 2022-03-03: 1 mL via INTRAOCULAR

## 2022-03-03 MED ORDER — SIGHTPATH DOSE#1 NA HYALUR & NA CHOND-NA HYALUR IO KIT
PACK | INTRAOCULAR | Status: DC | PRN
  Administered 2022-03-03: 1 via OPHTHALMIC

## 2022-03-03 SURGICAL SUPPLY — 13 items
CATARACT SUITE SIGHTPATH (MISCELLANEOUS) ×2 IMPLANT
DISSECTOR HYDRO NUCLEUS 50X22 (MISCELLANEOUS) ×2 IMPLANT
DRSG TEGADERM 2-3/8X2-3/4 SM (GAUZE/BANDAGES/DRESSINGS) ×2 IMPLANT
FEE CATARACT SUITE SIGHTPATH (MISCELLANEOUS) ×1 IMPLANT
GLOVE SURG SYN 7.5  E (GLOVE) ×1
GLOVE SURG SYN 7.5 E (GLOVE) ×1 IMPLANT
GLOVE SURG SYN 7.5 PF PI (GLOVE) ×1 IMPLANT
GLOVE SURG SYN 8.5  E (GLOVE) ×1
GLOVE SURG SYN 8.5 E (GLOVE) ×1 IMPLANT
GLOVE SURG SYN 8.5 PF PI (GLOVE) ×1 IMPLANT
LENS IOL B ×2 IMPLANT
LENS IOL ENVISTA UV+ TRC 12.5 IMPLANT
WATER STERILE IRR 250ML POUR (IV SOLUTION) ×2 IMPLANT

## 2022-03-03 NOTE — H&P (Signed)
[facility]   Primary Care Physician:  Smitty Cords, DO Ophthalmologist: Dr. Deberah Pelton  Pre-Procedure History & Physical: HPI:  Cindy Mccoy is a 61 y.o. female here for cataract surgery.   Past Medical History:  Diagnosis Date   Anxiety    Breast mass, left    Collapsed lung 08/23/2003   Dermatitis    contact   Family history of malignant neoplasm of breast    GERD (gastroesophageal reflux disease)    History of chicken pox    Pure hypercholesterolemia    Unspecified menopausal and postmenopausal disorder     Past Surgical History:  Procedure Laterality Date   BREAST BIOPSY  1981   BREAST BIOPSY Left 10/13/2014   CATARACT EXTRACTION W/PHACO Right 02/17/2022   Procedure: CATARACT EXTRACTION PHACO AND INTRAOCULAR LENS PLACEMENT (IOC) CRIGHT Kizzie Bane;  Surgeon: Estanislado Pandy, MD;  Location: [facility];  Service: Ophthalmology;  Laterality: Right;  25.89 02:19.2    Prior to Admission medications   Medication Sig Start Date End Date Taking? Authorizing Provider  levothyroxine (SYNTHROID) 137 MCG tablet Take 1 tablet (137 mcg total) by mouth daily before breakfast. 05/12/21  Yes Karamalegos, Netta Neat, DO  Multiple Vitamin (MULTIVITAMIN) tablet Take 1 tablet by mouth daily.   Yes [provider]  omeprazole (PRILOSEC) 20 MG capsule TAKE 1 CAPSULE BY MOUTH EVERY DAY BEFORE BREAKFAST 10/25/21  Yes Althea Charon, Netta Neat, DO    Allergies as of 12/20/2021   (Not on File)    Family History  Problem Relation Age of Onset   COPD Mother    Breast cancer Mother    Heart disease Father        CHF    Social History   Socioeconomic History   Marital status: Married    Spouse name: Not on file   Number of children: Not on file   Years of education: Not on file   Highest education level: Not on file  Occupational History   Occupation: CNA  Tobacco Use   Smoking status: Former    Types: Cigarettes    Quit date:  2005    Years since quitting: 18.5   Smokeless tobacco: Never   Tobacco comments:    29 pack per year history, quit several years ago  Vaping Use   Vaping Use: Never used  Substance and Sexual Activity   Alcohol use: No    Comment: 1-2 drinks per month   Drug use: No    Comment: Remote marijuana in highschool- no injections   Sexual activity: Not on file  Other Topics Concern   Not on file  Social History Narrative   Not on file   Social Determinants of Health   Financial Resource Strain: Not on file  Food Insecurity: Not on file  Transportation Needs: Not on file  Physical Activity: Not on file  Stress: Not on file  Social Connections: Not on file  Intimate Partner Violence: Not on file    Review of Systems: See HPI, otherwise negative ROS  Physical Exam: BP (!) 154/94   Pulse 66   Temp 97.6 F (36.4 C)   Wt 110.7 kg   LMP 09/23/2012   SpO2 96%   BMI 38.22 kg/m  General:   Alert, cooperative in NAD Head:  Normocephalic and atraumatic. Respiratory:  Normal work of breathing. Cardiovascular:  RRR  Impression/Plan: Cindy Mccoy is here for cataract surgery.  Risks, benefits, limitations, and alternatives regarding cataract surgery have  been reviewed with the patient.  Questions have been answered.  All parties agreeable.   Estanislado Pandy, MD  03/03/2022, 11:47 AM

## 2022-03-03 NOTE — Anesthesia Postprocedure Evaluation (Signed)
Anesthesia Post Note  Patient: Cindy Mccoy  Procedure(s) Performed: CATARACT EXTRACTION PHACO AND INTRAOCULAR LENS PLACEMENT (IOC) LEFT ENVISTA TORIC LENS (Left: Eye)     Patient location during evaluation: PACU Anesthesia Type: MAC Level of consciousness: awake and alert Pain management: pain level controlled Vital Signs Assessment: post-procedure vital signs reviewed and stable Respiratory status: spontaneous breathing and nonlabored ventilation Cardiovascular status: blood pressure returned to baseline Postop Assessment: no apparent nausea or vomiting Anesthetic complications: no   No notable events documented.  Daneli Butkiewicz [employer]

## 2022-03-03 NOTE — Transfer of Care (Signed)
Immediate Anesthesia Transfer of Care Note  Patient: Cindy Mccoy  Procedure(s) Performed: CATARACT EXTRACTION PHACO AND INTRAOCULAR LENS PLACEMENT (IOC) LEFT ENVISTA TORIC LENS (Left: Eye)  Patient Location: PACU  Anesthesia Type: MAC  Level of Consciousness: awake, alert  and patient cooperative  Airway and Oxygen Therapy: Patient Spontanous Breathing and Patient connected to supplemental oxygen  Post-op Assessment: Post-op Vital signs reviewed, Patient's Cardiovascular Status Stable, Respiratory Function Stable, Patent Airway and No signs of Nausea or vomiting  Post-op Vital Signs: Reviewed and stable  Complications: No notable events documented.

## 2022-03-03 NOTE — Op Note (Signed)
OPERATIVE NOTE  Cindy Mccoy [id number] 03/03/2022   PREOPERATIVE DIAGNOSIS: Nuclear sclerotic cataract left eye. H25.12   POSTOPERATIVE DIAGNOSIS: Nuclear sclerotic cataract left eye. H25.12   PROCEDURE:  Phacoemusification with Toric posterior chamber intraocular lens placement of the left eye  Ultrasound time: Procedure(s) with comments: CATARACT EXTRACTION PHACO AND INTRAOCULAR LENS PLACEMENT (IOC) LEFT ENVISTA TORIC LENS (Left) - 7.02 00:53.2  LENS:   Implant Name Type Inv. Item Serial No. Manufacturer Lot No. LRB No. Used Action  LENS IOL B - [id number]  LENS IOL B  [employer] [id number] Left 1 Implanted    [id number] Toric intraocular lens with 1.25 diopters of cylindrical power with axis orientation at 123 degrees.  SURGEON:  Julious Payer. Rolley Sims, MD   ANESTHESIA:  Topical with tetracaine drops, augmented with 1% preservative-free intracameral lidocaine.   COMPLICATIONS:  None.   DESCRIPTION OF PROCEDURE:  The patient was identified in the holding room and transported to the operating room and placed in the supine position under the operating microscope.  The left eye was identified as the operative eye, and the eye was marked at 0, 180 and 270 degrees with a toric marker. The eyewas prepped and draped in the usual sterile ophthalmic fashion.   A 1 millimeter clear-corneal paracentesis was made inferotemporally. Preservative-free 1% lidocaine mixed with 1:1,000 bisulfite-free aqueous solution of epinephrine was injected into the anterior chamber. The anterior chamber was then filled with Viscoat viscoelastic. A 2.4 millimeter keratome was used to make a clear-corneal incision superotemporally. A curvilinear capsulorrhexis was made with a cystotome and capsulorrhexis forceps. Balanced salt solution was used to hydrodissect and hydrodelineate the nucleus. Phacoemulsification was then used to remove the lens nucleus and epinucleus. The remaining cortex was then removed using the irrigation  and aspiration handpiece. Provisc was then placed into the capsular bag to distend it for lens placement. The Verion digital marker was used to align the implant at the intended axis.  A [id number] Toric lens was then injected into the capsular bag.  It was rotated clockwise until the axis marks on the lens were approximately 15 degrees in the counterclockwise direction to the intended alignment.  The viscoelastic was aspirated from the eye using the irrigation aspiration handpiece.  Then, a blunt chopper through the sideport incision was used to rotate the lens in a clockwise direction until the axis markings of the intraocular lens were lined up with the toric markings on the eye.  Balanced salt solution was then used to hydrate the wounds.   The anterior chamber was inflated to a physiologic pressure with balanced salt solution.  No wound leaks were noted. Vigamox was injected intracamerally.  Timolol and Brimonidine drops were applied to the eye.  The patient was taken to the recovery room in stable condition without complications of anesthesia or surgery.  [name] [place] 03/03/2022, 12:47 PM

## 2022-03-03 NOTE — Anesthesia Preprocedure Evaluation (Signed)
Anesthesia Evaluation  Patient identified by MRN, date of birth, ID band Patient awake    Reviewed: Allergy & Precautions, H&P , NPO status , Patient's Chart, lab work & pertinent test results  History of Anesthesia Complications Negative for: history of anesthetic complications  Airway Mallampati: II  TM Distance: >3 FB Neck ROM: Full    Dental no notable dental hx.    Pulmonary former smoker,  Pneumothorax 2/2 bleb in the setting of heaving smoking. Quit many years ago    Pulmonary exam normal        Cardiovascular negative cardio ROS Normal cardiovascular exam     Neuro/Psych Anxiety negative neurological ROS     GI/Hepatic Neg liver ROS, GERD  Controlled,  Endo/Other  Hypothyroidism BMI 38  Renal/GU negative Renal ROS     Musculoskeletal negative musculoskeletal ROS (+)   Abdominal (+) + obese,   Peds  Hematology negative hematology ROS (+)   Anesthesia Other Findings   Reproductive/Obstetrics                             Anesthesia Physical  Anesthesia Plan  ASA: 2  Anesthesia Plan: MAC   Post-op Pain Management: Minimal or no pain anticipated   Induction: Intravenous  PONV Risk Score and Plan: 2 and TIVA, Midazolam and Treatment may vary due to age or medical condition  Airway Management Planned: Nasal Cannula and Natural Airway  Additional Equipment: None  Intra-op Plan:   Post-operative Plan:   Informed Consent: I have reviewed the patients History and Physical, chart, labs and discussed the procedure including the risks, benefits and alternatives for the proposed anesthesia with the patient or authorized representative who has indicated his/her understanding and acceptance.       Plan Discussed with: CRNA  Anesthesia Plan Comments:         Anesthesia Quick Evaluation

## 2022-03-04 ENCOUNTER — Encounter: Payer: Self-pay | Admitting: Ophthalmology

## 2022-03-09 ENCOUNTER — Encounter: Payer: Self-pay | Admitting: Ophthalmology

## 2022-03-09 NOTE — Discharge Instructions (Signed)

## 2022-03-14 ENCOUNTER — Ambulatory Visit: Payer: BC Managed Care – PPO | Admitting: Anesthesiology

## 2022-03-14 ENCOUNTER — Other Ambulatory Visit: Payer: Self-pay

## 2022-03-14 ENCOUNTER — Encounter
Admission: RE | Disposition: A | Discharge status: Home / Self Care | Payer: Self-pay | Source: Home / Self Care | Attending: Ophthalmology

## 2022-03-14 ENCOUNTER — Ambulatory Visit
Admission: RE | Admit: 2022-03-14 | Discharge: 2022-03-14 | Disposition: A | Discharge status: Home / Self Care | Payer: BC Managed Care – PPO | Attending: Ophthalmology | Admitting: Ophthalmology

## 2022-03-14 ENCOUNTER — Encounter: Payer: Self-pay | Admitting: Ophthalmology

## 2022-03-14 DIAGNOSIS — Y838 Other surgical procedures as the cause of abnormal reaction of the patient, or of later complication, without mention of misadventure at the time of the procedure: Secondary | ICD-10-CM | POA: Diagnosis not present

## 2022-03-14 DIAGNOSIS — T8522XA Displacement of intraocular lens, initial encounter: Secondary | ICD-10-CM | POA: Insufficient documentation

## 2022-03-14 DIAGNOSIS — Z87891 Personal history of nicotine dependence: Secondary | ICD-10-CM | POA: Diagnosis not present

## 2022-03-14 DIAGNOSIS — E039 Hypothyroidism, unspecified: Secondary | ICD-10-CM | POA: Insufficient documentation

## 2022-03-14 DIAGNOSIS — E669 Obesity, unspecified: Secondary | ICD-10-CM | POA: Insufficient documentation

## 2022-03-14 DIAGNOSIS — Z6838 Body mass index (BMI) 38.0-38.9, adult: Secondary | ICD-10-CM | POA: Insufficient documentation

## 2022-03-14 DIAGNOSIS — Y772 Prosthetic and other implants, materials and accessory ophthalmic devices associated with adverse incidents: Secondary | ICD-10-CM | POA: Diagnosis not present

## 2022-03-14 DIAGNOSIS — K219 Gastro-esophageal reflux disease without esophagitis: Secondary | ICD-10-CM | POA: Diagnosis not present

## 2022-03-14 DIAGNOSIS — F419 Anxiety disorder, unspecified: Secondary | ICD-10-CM | POA: Diagnosis not present

## 2022-03-14 DIAGNOSIS — R7303 Prediabetes: Secondary | ICD-10-CM

## 2022-03-14 HISTORY — PX: CATARACT EXTRACTION W/PHACO: SHX586

## 2022-03-14 SURGERY — PHACOEMULSIFICATION, CATARACT, WITH IOL INSERTION
Anesthesia: Monitor Anesthesia Care | Site: Eye | Laterality: Left

## 2022-03-14 MED ORDER — SIGHTPATH DOSE#1 BSS IO SOLN
INTRAOCULAR | Status: DC | PRN
  Administered 2022-03-14: 2 mL via OPHTHALMIC

## 2022-03-14 MED ORDER — LIDOCAINE HCL (PF) 2 % IJ SOLN
INTRAOCULAR | Status: DC | PRN
  Administered 2022-03-14: .5 mL via INTRAOCULAR

## 2022-03-14 MED ORDER — SIGHTPATH DOSE#1 NA HYALUR & NA CHOND-NA HYALUR IO KIT
PACK | INTRAOCULAR | Status: DC | PRN
  Administered 2022-03-14: 1 via OPHTHALMIC

## 2022-03-14 MED ORDER — TETRACAINE HCL 0.5 % OP SOLN
1.0000 [drp] | OPHTHALMIC | Status: DC | PRN
  Administered 2022-03-14 (×3): 1 [drp] via OPHTHALMIC

## 2022-03-14 MED ORDER — BRIMONIDINE TARTRATE-TIMOLOL 0.2-0.5 % OP SOLN: OPHTHALMIC | Status: DC | PRN

## 2022-03-14 MED ORDER — MOXIFLOXACIN HCL 0.5 % OP SOLN
OPHTHALMIC | Status: DC | PRN
  Administered 2022-03-14: 0.2 mL via OPHTHALMIC

## 2022-03-14 MED ORDER — MIDAZOLAM HCL 2 MG/2ML IJ SOLN
INTRAMUSCULAR | Status: DC | PRN
  Administered 2022-03-14 (×2): 1 mg via INTRAVENOUS

## 2022-03-14 MED ORDER — ARMC OPHTHALMIC DILATING DROPS
1.0000 | OPHTHALMIC | Status: DC | PRN
  Administered 2022-03-14 (×3): 1 via OPHTHALMIC

## 2022-03-14 MED ORDER — SIGHTPATH DOSE#1 BSS IO SOLN
INTRAOCULAR | Status: DC | PRN
  Administered 2022-03-14: 15 mL

## 2022-03-14 MED ORDER — FENTANYL CITRATE (PF) 100 MCG/2ML IJ SOLN
INTRAMUSCULAR | Status: DC | PRN
  Administered 2022-03-14 (×2): 50 ug via INTRAVENOUS

## 2022-03-14 SURGICAL SUPPLY — 22 items
CANNULA ANT/CHMB 27G (MISCELLANEOUS) IMPLANT
CANNULA ANT/CHMB 27GA (MISCELLANEOUS) IMPLANT
CATARACT SUITE SIGHTPATH (MISCELLANEOUS) ×2 IMPLANT
DISSECTOR HYDRO NUCLEUS 50X22 (MISCELLANEOUS) ×2 IMPLANT
DRSG TEGADERM 2-3/8X2-3/4 SM (GAUZE/BANDAGES/DRESSINGS) ×2 IMPLANT
FEE CATARACT SUITE SIGHTPATH (MISCELLANEOUS) ×1 IMPLANT
GLOVE SURG SYN 7.5  E (GLOVE) ×1
GLOVE SURG SYN 7.5 E (GLOVE) ×1 IMPLANT
GLOVE SURG SYN 7.5 PF PI (GLOVE) ×1 IMPLANT
GLOVE SURG SYN 8.5  E (GLOVE) ×1
GLOVE SURG SYN 8.5 E (GLOVE) ×1 IMPLANT
GLOVE SURG SYN 8.5 PF PI (GLOVE) ×1 IMPLANT
NDL FILTER BLUNT 18X1 1/2 (NEEDLE) IMPLANT
NEEDLE FILTER BLUNT 18X 1/2SAF (NEEDLE)
NEEDLE FILTER BLUNT 18X1 1/2 (NEEDLE) IMPLANT
PACK VIT ANT 23G (MISCELLANEOUS) IMPLANT
RING MALYGIN (MISCELLANEOUS) IMPLANT
SUT ETHILON 10-0 CS-B-6CS-B-6 (SUTURE)
SUTURE EHLN 10-0 CS-B-6CS-B-6 (SUTURE) IMPLANT
SYR 3ML LL SCALE MARK (SYRINGE) IMPLANT
SYR 5ML LL (SYRINGE) IMPLANT
WATER STERILE IRR 250ML POUR (IV SOLUTION) ×2 IMPLANT

## 2022-03-14 NOTE — Anesthesia Postprocedure Evaluation (Signed)
Anesthesia Post Note  Patient: Cindy Mccoy  Procedure(s) Performed: REPOSTIONING OF INTRAOCULAR LENS LEFT (Left: Eye)     Patient location during evaluation: PACU Anesthesia Type: MAC Level of consciousness: awake and alert Pain management: pain level controlled Vital Signs Assessment: post-procedure vital signs reviewed and stable Respiratory status: spontaneous breathing and nonlabored ventilation Cardiovascular status: blood pressure returned to baseline Postop Assessment: no apparent nausea or vomiting Anesthetic complications: no   No notable events documented.  Minette Manders [employer]

## 2022-03-14 NOTE — H&P (Signed)
[facility]   Primary Care Physician:  Smitty Cords, DO Ophthalmologist: Dr. Deberah Pelton  Pre-Procedure History & Physical: HPI:  Cindy Mccoy is a 61 y.o. female here for intraocular lens implant rotation for malposition of intraocular lens implant.   Past Medical History:  Diagnosis Date   Anxiety    Breast mass, left    Collapsed lung 08/23/2003   Dermatitis    contact   Family history of malignant neoplasm of breast    GERD (gastroesophageal reflux disease)    History of chicken pox    Pure hypercholesterolemia    Unspecified menopausal and postmenopausal disorder     Past Surgical History:  Procedure Laterality Date   BREAST BIOPSY  1981   BREAST BIOPSY Left 10/13/2014   CATARACT EXTRACTION W/PHACO Right 02/17/2022   Procedure: CATARACT EXTRACTION PHACO AND INTRAOCULAR LENS PLACEMENT (IOC) CRIGHT Kizzie Bane;  Surgeon: Estanislado Pandy, MD;  Location: [facility];  Service: Ophthalmology;  Laterality: Right;  25.89 02:19.2   CATARACT EXTRACTION W/PHACO Left 03/03/2022   Procedure: CATARACT EXTRACTION PHACO AND INTRAOCULAR LENS PLACEMENT (IOC) LEFT ENVISTA TORIC LENS;  Surgeon: Estanislado Pandy, MD;  Location: [facility];  Service: Ophthalmology;  Laterality: Left;  7.02 00:53.2    Prior to Admission medications   Medication Sig Start Date End Date Taking? Authorizing Provider  levothyroxine (SYNTHROID) 137 MCG tablet Take 1 tablet (137 mcg total) by mouth daily before breakfast. 05/12/21  Yes Karamalegos, Netta Neat, DO  Multiple Vitamin (MULTIVITAMIN) tablet Take 1 tablet by mouth daily.   Yes [provider]  omeprazole (PRILOSEC) 20 MG capsule TAKE 1 CAPSULE BY MOUTH EVERY DAY BEFORE BREAKFAST 10/25/21  Yes Karamalegos, Netta Neat, DO    Allergies as of 03/08/2022   (No Known Allergies)    Family History  Problem Relation Age of Onset   COPD Mother    Breast cancer Mother    Heart disease Father         CHF    Social History   Socioeconomic History   Marital status: Married    Spouse name: Not on file   Number of children: Not on file   Years of education: Not on file   Highest education level: Not on file  Occupational History   Occupation: CNA  Tobacco Use   Smoking status: Former    Types: Cigarettes    Quit date: 2005    Years since quitting: 18.5   Smokeless tobacco: Never   Tobacco comments:    29 pack per year history, quit several years ago  Vaping Use   Vaping Use: Never used  Substance and Sexual Activity   Alcohol use: No    Comment: 1-2 drinks per month   Drug use: No    Comment: Remote marijuana in highschool- no injections   Sexual activity: Not on file  Other Topics Concern   Not on file  Social History Narrative   Not on file   Social Determinants of Health   Financial Resource Strain: Not on file  Food Insecurity: Not on file  Transportation Needs: Not on file  Physical Activity: Not on file  Stress: Not on file  Social Connections: Not on file  Intimate Partner Violence: Not on file    Review of Systems: See HPI, otherwise negative ROS  Physical Exam: BP (!) 141/105   Pulse 65   Temp 97.7 F (36.5 C) (Temporal)   Resp 16   Ht 5\' 7"  (  1.702 m)   Wt 111.6 kg   LMP 09/23/2012   SpO2 96%   BMI 38.53 kg/m  General:   Alert, cooperative in NAD Head:  Normocephalic and atraumatic. Respiratory:  Normal work of breathing. Cardiovascular:  RRR  Impression/Plan: Cindy Mccoy is here for cataract surgery.  Risks, benefits, limitations, and alternatives regarding intraocular lens implant rotation have been reviewed with the patient.  Questions have been answered.  All parties agreeable.   Estanislado Pandy, MD  03/14/2022, 8:51 AM

## 2022-03-14 NOTE — Anesthesia Preprocedure Evaluation (Signed)
Anesthesia Evaluation  Patient identified by MRN, date of birth, ID band Patient awake    Reviewed: Allergy & Precautions, H&P , NPO status , Patient's Chart, lab work & pertinent test results  Airway Mallampati: II  TM Distance: >3 FB Neck ROM: Full    Dental no notable dental hx.    Pulmonary former smoker,  Pneumothorax 2/2 bleb in the setting of heaving smoking. Quit many years ago    Pulmonary exam normal        Cardiovascular negative cardio ROS Normal cardiovascular exam     Neuro/Psych Anxiety negative neurological ROS     GI/Hepatic Neg liver ROS, GERD  Controlled,  Endo/Other  Hypothyroidism BMI 38  Renal/GU negative Renal ROS     Musculoskeletal negative musculoskeletal ROS (+)   Abdominal (+) + obese,   Peds  Hematology negative hematology ROS (+)   Anesthesia Other Findings   Reproductive/Obstetrics                             Anesthesia Physical  Anesthesia Plan  ASA: 2  Anesthesia Plan: MAC   Post-op Pain Management: Minimal or no pain anticipated   Induction: Intravenous  PONV Risk Score and Plan: 2 and TIVA, Midazolam and Treatment may vary due to age or medical condition  Airway Management Planned: Nasal Cannula and Natural Airway  Additional Equipment: None  Intra-op Plan:   Post-operative Plan:   Informed Consent: I have reviewed the patients History and Physical, chart, labs and discussed the procedure including the risks, benefits and alternatives for the proposed anesthesia with the patient or authorized representative who has indicated his/her understanding and acceptance.       Plan Discussed with: CRNA  Anesthesia Plan Comments:         Anesthesia Quick Evaluation

## 2022-03-14 NOTE — Anesthesia Procedure Notes (Signed)
Procedure Name: MAC Date/Time: 03/14/2022 9:00 AM  Performed by: Dionne Bucy, CRNAPre-anesthesia Checklist: Patient identified, Emergency Drugs available, Suction available, Patient being monitored and Timeout performed Patient Re-evaluated:Patient Re-evaluated prior to induction Oxygen Delivery Method: Nasal cannula Placement Confirmation: positive ETCO2

## 2022-03-14 NOTE — Op Note (Signed)
OPERATIVE NOTE  Cindy Mccoy [id number] 03/14/2022   PREOPERATIVE DIAGNOSIS: Malposition of intraocular lens implant.   POSTOPERATIVE DIAGNOSIS: Malposition of intraocular lens implant.   PROCEDURE:  Rotation of posterior chamber intraocular lens placement of the left eye  Ultrasound time: Procedure(s): REPOSTIONING OF INTRAOCULAR LENS LEFT (Left)  LENS:  Previously implanted +12.50 D [id number]  SURGEON:  Julious Payer. Rolley Sims, MD   ANESTHESIA:  Topical with tetracaine drops, augmented with 1% preservative-free intracameral lidocaine.   COMPLICATIONS:  None.   DESCRIPTION OF PROCEDURE:  The patient was identified in the holding room and transported to the operating room and placed in the supine position under the operating microscope.  The left eye was identified as the operative eye, which was prepped and draped in the usual sterile ophthalmic fashion.   A Sinskey hook was used to open the previously created paracentesis. Preservative-free 1% lidocaine mixed with 1:1,000 bisulfite-free aqueous solution of epinephrine was injected into the anterior chamber. The anterior chamber was then filled with Provisc viscoelastic. Provisc was also injected under the previously created anterior capsular rim to free up the lens implant. The Sinksey hook was used to open up the previously created main incision. The hook was then used to rotate the previously implanted +12.50 [id number] to about 128 degrees. Irrigation aspiration handpiece was used to remove the remaining viscoelastic. The Verion digital display was used to confirm correct alignment of the intraocular lens implant. The lens was intentionally under-rotated counterclockwise by about 5 degrees to account for potential postoperative rotation, which occurred previously.   Wounds were hydrated with balanced salt solution.  The anterior chamber was inflated to a physiologic pressure with balanced salt solution.  No wound leaks were noted. Vigamox  was injected into the anterior chamber.  The patient was taken to the recovery room in stable condition without complications of anesthesia or surgery.  [name] [place] 03/14/2022, 9:26 AM

## 2022-03-14 NOTE — Transfer of Care (Signed)
Immediate Anesthesia Transfer of Care Note  Patient: Cindy Mccoy  Procedure(s) Performed: REPOSTIONING OF INTRAOCULAR LENS LEFT (Left: Eye)  Patient Location: PACU  Anesthesia Type: MAC  Level of Consciousness: awake, alert  and patient cooperative  Airway and Oxygen Therapy: Patient Spontanous Breathing and Patient connected to supplemental oxygen  Post-op Assessment: Post-op Vital signs reviewed, Patient's Cardiovascular Status Stable, Respiratory Function Stable, Patent Airway and No signs of Nausea or vomiting  Post-op Vital Signs: Reviewed and stable  Complications: No notable events documented.

## 2022-03-15 ENCOUNTER — Encounter: Payer: Self-pay | Admitting: Ophthalmology

## 2022-04-15 DIAGNOSIS — Z961 Presence of intraocular lens: Secondary | ICD-10-CM | POA: Diagnosis not present

## 2022-05-25 ENCOUNTER — Other Ambulatory Visit: Payer: Self-pay | Admitting: Family Medicine

## 2022-05-25 DIAGNOSIS — E034 Atrophy of thyroid (acquired): Secondary | ICD-10-CM

## 2022-05-25 NOTE — Telephone Encounter (Signed)
Requested Prescriptions  Pending Prescriptions Disp Refills  . levothyroxine (SYNTHROID) 137 MCG tablet [Pharmacy Med Name: LEVOTHYROXINE 137 MCG TABLET] 90 tablet 1    Sig: TAKE 1 TABLET BY MOUTH DAILY BEFORE BREAKFAST.     Endocrinology:  Hypothyroid Agents Failed - 05/25/2022  2:32 AM      Failed - Valid encounter within last 12 months    Recent Outpatient Visits          6 months ago COVID-19 virus infection   [place], Devonne Doughty, DO   1 year ago Hypothyroidism due to acquired atrophy of thyroid   [place], Devonne Doughty, DO   1 year ago Annual physical exam   [facility] Olin Hauser, DO   1 year ago Hypothyroidism due to acquired atrophy of thyroid   [facility] Olin Hauser, DO   2 years ago Annual physical exam   [place], DO             Passed - TSH in normal range and within 360 days    TSH  Date Value Ref Range Status  11/11/2021 1.11 0.40 - 4.50 mIU/L Final

## 2022-08-27 ENCOUNTER — Other Ambulatory Visit: Payer: Self-pay | Admitting: Family Medicine

## 2022-08-27 DIAGNOSIS — K219 Gastro-esophageal reflux disease without esophagitis: Secondary | ICD-10-CM

## 2022-08-29 NOTE — Telephone Encounter (Signed)
Requested medications are due for refill today.  yes  Requested medications are on the active medications list.  yes  Last refill. 10/25/2021 #90 1 rf  Future visit scheduled.   no  Notes to clinic.  Pharmacy needs Dx code.    Requested Prescriptions  Pending Prescriptions Disp Refills   omeprazole (PRILOSEC) 20 MG capsule [Pharmacy Med Name: OMEPRAZOLE DR 20 MG CAPSULE] 90 capsule 1    Sig: TAKE 1 CAPSULE BY MOUTH EVERY DAY BEFORE BREAKFAST     Gastroenterology: Proton Pump Inhibitors Failed - 08/27/2022 10:27 AM      Failed - Valid encounter within last 12 months    Recent Outpatient Visits           9 months ago COVID-19 virus infection   [place], DO   1 year ago Hypothyroidism due to acquired atrophy of thyroid   [place], Devonne Doughty, DO   1 year ago Annual physical exam   [facility] [place], Devonne Doughty, DO   2 years ago Hypothyroidism due to acquired atrophy of thyroid   [facility] Olin Hauser, DO   2 years ago Annual physical exam   [place], Devonne Doughty, DO

## 2022-09-15 ENCOUNTER — Other Ambulatory Visit: Payer: Self-pay

## 2022-09-15 ENCOUNTER — Telehealth: Payer: Self-pay

## 2022-09-15 DIAGNOSIS — R030 Elevated blood-pressure reading, without diagnosis of hypertension: Secondary | ICD-10-CM

## 2022-09-15 DIAGNOSIS — E034 Atrophy of thyroid (acquired): Secondary | ICD-10-CM

## 2022-09-15 DIAGNOSIS — Z0000 Encounter for general adult medical examination without abnormal findings: Secondary | ICD-10-CM

## 2022-09-15 DIAGNOSIS — E7800 Pure hypercholesterolemia, unspecified: Secondary | ICD-10-CM

## 2022-09-15 DIAGNOSIS — E669 Obesity, unspecified: Secondary | ICD-10-CM

## 2022-09-15 DIAGNOSIS — R7303 Prediabetes: Secondary | ICD-10-CM

## 2022-09-15 NOTE — Telephone Encounter (Signed)
Copied from [place] [phone]. Topic: Appointment Scheduling - Scheduling Inquiry for Clinic >> Sep 14, 2022  3:23 PM Erskine Squibb wrote: Reason for CRM: The patient scheduled a physical for next Wed the 31st and she usually has labs done prior to her visit. She would like to have an order to get this done prior to her visit and needs an order. She states she can do this on Friday the 26th if that is ok. Please assist patient further.    I called and left a message letting her know that was fine. I asked that she call back to schedule a LAB ONLY appt for tomorrow, (Friday). I am going to order the labs now and have them pending for her tomorrow.

## 2022-09-16 ENCOUNTER — Other Ambulatory Visit: Payer: BC Managed Care – PPO

## 2022-09-16 DIAGNOSIS — R7303 Prediabetes: Secondary | ICD-10-CM | POA: Diagnosis not present

## 2022-09-16 DIAGNOSIS — E034 Atrophy of thyroid (acquired): Secondary | ICD-10-CM | POA: Diagnosis not present

## 2022-09-16 DIAGNOSIS — R030 Elevated blood-pressure reading, without diagnosis of hypertension: Secondary | ICD-10-CM | POA: Diagnosis not present

## 2022-09-16 DIAGNOSIS — E7800 Pure hypercholesterolemia, unspecified: Secondary | ICD-10-CM | POA: Diagnosis not present

## 2022-09-17 LAB — COMPREHENSIVE METABOLIC PANEL
AG Ratio: 1.5 (calc) (ref 1.0–2.5)
ALT: 12 U/L (ref 6–29)
AST: 13 U/L (ref 10–35)
Albumin: 3.8 g/dL (ref 3.6–5.1)
Alkaline phosphatase (APISO): 77 U/L (ref 37–153)
BUN: 12 mg/dL (ref 7–25)
CO2: 25 mmol/L (ref 20–32)
Calcium: 9 mg/dL (ref 8.6–10.4)
Chloride: 106 mmol/L (ref 98–110)
Creat: 0.75 mg/dL (ref 0.50–1.05)
Globulin: 2.5 g/dL (calc) (ref 1.9–3.7)
Glucose, Bld: 102 mg/dL — ABNORMAL HIGH (ref 65–99)
Potassium: 4.7 mmol/L (ref 3.5–5.3)
Sodium: 142 mmol/L (ref 135–146)
Total Bilirubin: 0.5 mg/dL (ref 0.2–1.2)
Total Protein: 6.3 g/dL (ref 6.1–8.1)

## 2022-09-17 LAB — TSH: TSH: 1.51 mIU/L (ref 0.40–4.50)

## 2022-09-17 LAB — CBC WITH DIFFERENTIAL/PLATELET
Absolute Monocytes: 533 cells/uL (ref 200–950)
Basophils Absolute: 37 cells/uL (ref 0–200)
Basophils Relative: 0.6 %
Eosinophils Absolute: 415 cells/uL (ref 15–500)
Eosinophils Relative: 6.7 %
HCT: 44.3 % (ref 35.0–45.0)
Hemoglobin: 15 g/dL (ref 11.7–15.5)
Lymphs Abs: 1779 cells/uL (ref 850–3900)
MCH: 30.1 pg (ref 27.0–33.0)
MCHC: 33.9 g/dL (ref 32.0–36.0)
MCV: 88.8 fL (ref 80.0–100.0)
MPV: 11.3 fL (ref 7.5–12.5)
Monocytes Relative: 8.6 %
Neutro Abs: 3435 cells/uL (ref 1500–7800)
Neutrophils Relative %: 55.4 %
Platelets: 222 10*3/uL (ref 140–400)
RBC: 4.99 10*6/uL (ref 3.80–5.10)
RDW: 13.1 % (ref 11.0–15.0)
Total Lymphocyte: 28.7 %
WBC: 6.2 10*3/uL (ref 3.8–10.8)

## 2022-09-17 LAB — HEMOGLOBIN A1C
Hgb A1c MFr Bld: 5.6 % of total Hgb (ref <5.7)
Mean Plasma Glucose: 114 mg/dL
eAG (mmol/L): 6.3 mmol/L

## 2022-09-17 LAB — LIPID PANEL
Cholesterol: 178 mg/dL (ref <200)
HDL: 51 mg/dL (ref >=50)
LDL Cholesterol (Calc): 104 mg/dL (calc) — ABNORMAL HIGH
Non-HDL Cholesterol (Calc): 127 mg/dL (calc) (ref <130)
Total CHOL/HDL Ratio: 3.5 (calc) (ref <5.0)
Triglycerides: 124 mg/dL (ref <150)

## 2022-09-17 LAB — T4, FREE: Free T4: 1.5 ng/dL (ref 0.8–1.8)

## 2022-09-21 ENCOUNTER — Ambulatory Visit (INDEPENDENT_AMBULATORY_CARE_PROVIDER_SITE_OTHER): Payer: BC Managed Care – PPO | Admitting: Family Medicine

## 2022-09-21 ENCOUNTER — Encounter: Payer: Self-pay | Admitting: Family Medicine

## 2022-09-21 VITALS — BP 126/84 | HR 69 | Ht 67.0 in | Wt 242.0 lb

## 2022-09-21 DIAGNOSIS — E7800 Pure hypercholesterolemia, unspecified: Secondary | ICD-10-CM

## 2022-09-21 DIAGNOSIS — R7303 Prediabetes: Secondary | ICD-10-CM | POA: Diagnosis not present

## 2022-09-21 DIAGNOSIS — E034 Atrophy of thyroid (acquired): Secondary | ICD-10-CM

## 2022-09-21 DIAGNOSIS — Z0000 Encounter for general adult medical examination without abnormal findings: Secondary | ICD-10-CM

## 2022-09-21 DIAGNOSIS — K219 Gastro-esophageal reflux disease without esophagitis: Secondary | ICD-10-CM

## 2022-09-21 DIAGNOSIS — R030 Elevated blood-pressure reading, without diagnosis of hypertension: Secondary | ICD-10-CM

## 2022-09-21 MED ORDER — LEVOTHYROXINE SODIUM 137 MCG PO TABS: 137.0000 ug | ORAL_TABLET | Freq: Every day | ORAL | 3 refills | Status: DC

## 2022-09-21 NOTE — Assessment & Plan Note (Signed)
Goal to keep improving lifestyle diet exercise Future consider weight management options, AVS given with med consideration

## 2022-09-21 NOTE — Assessment & Plan Note (Signed)
Still with borderline elevated BP in range pre-HTN DBP near 90 today repeat manual normalized - Home BP readings limited today No known complications / Hypothyroidism difficult to control on med - now seems controlled  Plan:  1. No treatment today - continue with goal for wt loss first 2. Encourage improved lifestyle - low sodium diet, regular exercise 3. Continue to monitor BP outside office, bring readings to next visit, if persistently >140/90 or new symptoms notify office sooner

## 2022-09-21 NOTE — Patient Instructions (Addendum)
Thank you for coming to the office today.  Future let me know if need to repeat A1c sugar or Thyroid sooner if not doing well. Or making progress.  Currently thyroid controlled, same dose  Recent Labs    11/11/21 0831 09/16/22 0954  HGBA1C 5.5 5.6   A1c 5.6, it overall looks good, below pre diabetes.   Recommend Shingrix (Shingles vaccine) at pharmacy 2 doses, 2-6 months apart - at pharmacy.   Call insurance find cost and coverage of the following - check the following: - Drug Tier, Preferred List, On Formulary - All will require a "Prior Authorization" from [place] first, before you can find out the cost - Find out if there is "Step Therapy" (other medicines required before you can try these)  Once you pick the one you want to try, let me know - we can get a sample ready IF we have it in stock. Then try it - and before running out of medicine, contact me back to order your Rx so we have time to get it processed.  For Weight Loss / Obesity only  Zepbound (same as Mounjaro) weekly injection  Wegovy (same as Ozempic) weekly injection - start 0.25mg  weekly, 1 dose per pen, single use, auto-injector - Unavailable on back order  3. Saxenda - DAILY injection - start 0.6mg  injection DAILY, you can increase the dose by 1 notch or 0.6 mg per week, if you don't tolerate a dose, can reduce it the next day. - Possibly covered  3. Contrave - oral medication, appetite suppression has wellbutrin/bupropion and naltrexone in it and it can also help with appetite, it is ordered through a speciality pharmacy. - $99 per month, through mail order.  Future make sure your insurance has weight loss coverage  WEIGHT MANAGEMENT  Dr Dennard Nip  [facility] [place], [place] [address] Ph: [phone]  ----------  DUE for FASTING BLOOD WORK (no food or drink after midnight before the lab appointment, only water or coffee without cream/sugar on the  morning of)  SCHEDULE "Lab Only" visit in the morning at the clinic for lab draw in 1 YEAR  - Make sure Lab Only appointment is at about 1 week before your next appointment, so that results will be available  For Lab Results, once available within 2-3 days of blood draw, you can can log in to MyChart online to view your results and a brief explanation. Also, we can discuss results at next follow-up visit.    Please schedule a Follow-up Appointment to: Return in about 1 year (around 09/22/2023) for 1 year fasting lab only then 1 week later Annual Physical.  If you have any other questions or concerns, please feel free to call the office or send a message through [place]. You may also schedule an earlier appointment if necessary.  Additionally, you may be receiving a survey about your experience at our office within a few days to 1 week by e-mail or mail. We value your feedback.  Nobie Putnam, DO [place]

## 2022-09-21 NOTE — Progress Notes (Signed)
Subjective:    Patient ID: Cindy Mccoy, female    DOB: 06/09/1961, 62 y.o.   MRN: [id number]  Cindy Mccoy is a 62 y.o. female presenting on 09/21/2022 for Annual Exam   HPI  Here for Annual Physical and Lab Review  FOLLOW-UP HYPOTHYROIDISM Update with last hypothyroidism labs TSH and Free T4 normal range reviewed last lab 08/2022 - Current dose Levothyroxine 158mcg is working well Will renew today  Elevated A1c / OBESITY BMI >37 A1c 5.6, previously 5.4 to 5.6 range. Below Pre Diabetes. Some weight loss in past 6 months   Elevated BP without HYPERTENSION Mild elevated BP, needs to recheck at home in future Current Meds - Not on medication   Lifestyle: Goal to improve diet lifestyle Denies CP, dyspnea, HA, edema, dizziness / lightheadedness   GERD On omeprazole 20mg  intermittently   Health Maintenance: Pap Smear 10/2021 Mammogram 10/2021      11/17/2021   10:45 AM 05/12/2021   10:43 AM 06/05/2020    8:40 AM  Depression screen PHQ 2/9  Decreased Interest 0 0 0  Down, Depressed, Hopeless 0 0 0  PHQ - 2 Score 0 0 0  Altered sleeping 0 0   Tired, decreased energy 0 0   Change in appetite 0 0   Feeling bad or failure about yourself  0 0   Trouble concentrating 0 0   Moving slowly or fidgety/restless 0 0   Suicidal thoughts 0 0   PHQ-9 Score 0 0   Difficult doing work/chores Not difficult at all Not difficult at all     Past Medical History:  Diagnosis Date   Anxiety    Breast mass, left    Collapsed lung 08/23/2003   Dermatitis    contact   Family history of malignant neoplasm of breast    GERD (gastroesophageal reflux disease)    History of chicken pox    Pure hypercholesterolemia    Unspecified menopausal and postmenopausal disorder    Past Surgical History:  Procedure Laterality Date   BREAST BIOPSY  1981   BREAST BIOPSY Left 10/13/2014   CATARACT EXTRACTION W/PHACO Right 02/17/2022   Procedure: CATARACT EXTRACTION PHACO AND INTRAOCULAR LENS  PLACEMENT ([place]) CRIGHT Tyler Deis;  Surgeon: Norvel Richards, MD;  Location: [place];  Service: Ophthalmology;  Laterality: Right;  25.89 02:19.2   CATARACT EXTRACTION W/PHACO Left 03/03/2022   Procedure: CATARACT EXTRACTION PHACO AND INTRAOCULAR LENS PLACEMENT ([place]) LEFT ENVISTA TORIC LENS;  Surgeon: Norvel Richards, MD;  Location: [place];  Service: Ophthalmology;  Laterality: Left;  7.02 00:53.2   CATARACT EXTRACTION W/PHACO Left 03/14/2022   Procedure: REPOSTIONING OF INTRAOCULAR LENS LEFT;  Surgeon: Norvel Richards, MD;  Location: [place];  Service: Ophthalmology;  Laterality: Left;   Social History   Socioeconomic History   Marital status: Married    Spouse name: Not on file   Number of children: Not on file   Years of education: Not on file   Highest education level: Not on file  Occupational History   Occupation: CNA  Tobacco Use   Smoking status: Former    Types: Cigarettes    Quit date: 2005    Years since quitting: 19.0   Smokeless tobacco: Never   Tobacco comments:    29 pack per year history, quit several years ago  Vaping Use   Vaping Use: Never used  Substance and Sexual Activity   Alcohol use: No    Comment: 1-2  drinks per month   Drug use: No    Comment: Remote marijuana in highschool- no injections   Sexual activity: Not on file  Other Topics Concern   Not on file  Social History Narrative   Not on file   Social Determinants of Health   Financial Resource Strain: Not on file  Food Insecurity: Not on file  Transportation Needs: Not on file  Physical Activity: Not on file  Stress: Not on file  Social Connections: Not on file  Intimate Partner Violence: Not on file   Family History  Problem Relation Age of Onset   COPD Mother    Breast cancer Mother    Heart disease Father        CHF   Current Outpatient Medications on File Prior to Visit  Medication Sig   Misc Natural Products (YUMVS  BEET ROOT-TART CHERRY PO) Take by mouth.   Multiple Vitamin (MULTIVITAMIN) tablet Take 1 tablet by mouth daily.   omeprazole (PRILOSEC) 20 MG capsule TAKE 1 CAPSULE BY MOUTH EVERY DAY BEFORE BREAKFAST   No current facility-administered medications on file prior to visit.    Review of Systems  Constitutional:  Negative for activity change, appetite change, chills, diaphoresis, fatigue and fever.  HENT:  Negative for congestion and hearing loss.   Eyes:  Negative for visual disturbance.  Respiratory:  Negative for cough, chest tightness, shortness of breath and wheezing.   Cardiovascular:  Negative for chest pain, palpitations and leg swelling.  Gastrointestinal:  Negative for abdominal pain, constipation, diarrhea, nausea and vomiting.  Genitourinary:  Negative for dysuria, frequency and hematuria.  Musculoskeletal:  Negative for arthralgias and neck pain.  Skin:  Negative for rash.  Neurological:  Negative for dizziness, weakness, light-headedness, numbness and headaches.  Hematological:  Negative for adenopathy.  Psychiatric/Behavioral:  Negative for behavioral problems, dysphoric mood and sleep disturbance.    Per HPI unless specifically indicated above      Objective:    BP 126/84 (BP Location: Left Arm, Cuff Size: Normal)   Pulse 69   Ht 5\' 7"  (1.702 m)   Wt 242 lb (109.8 kg)   LMP 09/23/2012   SpO2 97%   BMI 37.90 kg/m   Wt Readings from Last 3 Encounters:  09/21/22 242 lb (109.8 kg)  03/14/22 246 lb (111.6 kg)  03/03/22 244 lb (110.7 kg)    Physical Exam Vitals and nursing note reviewed.  Constitutional:      General: She is not in acute distress.    Appearance: She is well-developed. She is obese. She is not diaphoretic.     Comments: Well-appearing, comfortable, cooperative  HENT:     Head: Normocephalic and atraumatic.  Eyes:     General:        Right eye: No discharge.        Left eye: No discharge.     Conjunctiva/sclera: Conjunctivae normal.      Pupils: Pupils are equal, round, and reactive to light.  Neck:     Thyroid: No thyromegaly.     Vascular: No carotid bruit.  Cardiovascular:     Rate and Rhythm: Normal rate and regular rhythm.     Pulses: Normal pulses.     Heart sounds: Normal heart sounds. No murmur heard. Pulmonary:     Effort: Pulmonary effort is normal. No respiratory distress.     Breath sounds: Normal breath sounds. No wheezing or rales.  Abdominal:     General: Bowel sounds are normal. There is no  distension.     Palpations: Abdomen is soft. There is no mass.     Tenderness: There is no abdominal tenderness.  Musculoskeletal:        General: No tenderness. Normal range of motion.     Cervical back: Normal range of motion and neck supple.     Right lower leg: No edema.     Left lower leg: No edema.     Comments: Upper / Lower Extremities: - Normal muscle tone, strength bilateral upper extremities 5/5, lower extremities 5/5  Lymphadenopathy:     Cervical: No cervical adenopathy.  Skin:    General: Skin is warm and dry.     Findings: No erythema or rash.  Neurological:     Mental Status: She is alert and oriented to person, place, and time.     Comments: Distal sensation intact to light touch all extremities  Psychiatric:        Mood and Affect: Mood normal.        Behavior: Behavior normal.        Thought Content: Thought content normal.     Comments: Well groomed, good eye contact, normal speech and thoughts       Results for orders placed or performed in visit on 09/15/22  T4, free  Result Value Ref Range   Free T4 1.5 0.8 - 1.8 ng/dL  TSH  Result Value Ref Range   TSH 1.51 0.40 - 4.50 mIU/L  Hemoglobin A1c  Result Value Ref Range   Hgb A1c MFr Bld 5.6 <5.7 % of total Hgb   Mean Plasma Glucose 114 mg/dL   eAG (mmol/L) 6.3 mmol/L  Lipid panel  Result Value Ref Range   Cholesterol 178 <200 mg/dL   HDL 51 > OR = 50 mg/dL   Triglycerides 381 <017 mg/dL   LDL Cholesterol (Calc) 104 (H)  mg/dL (calc)   Total CHOL/HDL Ratio 3.5 <5.0 (calc)   Non-HDL Cholesterol (Calc) 127 <130 mg/dL (calc)  CBC with Differential  Result Value Ref Range   WBC 6.2 3.8 - 10.8 Thousand/uL   RBC 4.99 3.80 - 5.10 Million/uL   Hemoglobin 15.0 11.7 - 15.5 g/dL   HCT 51.0 25.8 - 52.7 %   MCV 88.8 80.0 - 100.0 fL   MCH 30.1 27.0 - 33.0 pg   MCHC 33.9 32.0 - 36.0 g/dL   RDW 78.2 42.3 - 53.6 %   Platelets 222 140 - 400 Thousand/uL   MPV 11.3 7.5 - 12.5 fL   Neutro Abs 3,435 1,500 - 7,800 cells/uL   Lymphs Abs 1,779 850 - 3,900 cells/uL   Absolute Monocytes 533 200 - 950 cells/uL   Eosinophils Absolute 415 15 - 500 cells/uL   Basophils Absolute 37 0 - 200 cells/uL   Neutrophils Relative % 55.4 %   Total Lymphocyte 28.7 %   Monocytes Relative 8.6 %   Eosinophils Relative 6.7 %   Basophils Relative 0.6 %  Comprehensive Metabolic Panel (CMET)  Result Value Ref Range   Glucose, Bld 102 (H) 65 - 99 mg/dL   BUN 12 7 - 25 mg/dL   Creat 1.44 3.15 - 4.00 mg/dL   BUN/Creatinine Ratio SEE NOTE: 6 - 22 (calc)   Sodium 142 135 - 146 mmol/L   Potassium 4.7 3.5 - 5.3 mmol/L   Chloride 106 98 - 110 mmol/L   CO2 25 20 - 32 mmol/L   Calcium 9.0 8.6 - 10.4 mg/dL   Total Protein 6.3 6.1 - 8.1 g/dL  Albumin 3.8 3.6 - 5.1 g/dL   Globulin 2.5 1.9 - 3.7 g/dL (calc)   AG Ratio 1.5 1.0 - 2.5 (calc)   Total Bilirubin 0.5 0.2 - 1.2 mg/dL   Alkaline phosphatase (APISO) 77 37 - 153 U/L   AST 13 10 - 35 U/L   ALT 12 6 - 29 U/L      Assessment & Plan:   Problem List Items Addressed This Visit     GERD (gastroesophageal reflux disease)    Controlled GERD On intermittent or every other day PPI       Hypothyroidism    Controlled hypothyroidism on current dose levothyroxine Last TSH Free T4 still in normal range Continue current Levothyroxine 153mcg daily refilled Recheck yearly or 6 months if needed      Relevant Medications   levothyroxine (SYNTHROID) 137 MCG tablet   Other Relevant Orders   TSH    T4, free   Morbid obesity ([place])    Goal to keep improving lifestyle diet exercise Future consider weight management options, AVS given with med consideration      Relevant Orders   COMPLETE METABOLIC PANEL WITH GFR   Lipid panel   Pre-hypertension    Still with borderline elevated BP in range pre-HTN DBP near 90 today repeat manual normalized - Home BP readings limited today No known complications / Hypothyroidism difficult to control on med - now seems controlled  Plan:  1. No treatment today - continue with goal for wt loss first 2. Encourage improved lifestyle - low sodium diet, regular exercise 3. Continue to monitor BP outside office, bring readings to next visit, if persistently >140/90 or new symptoms notify office sooner      Relevant Orders   COMPLETE METABOLIC PANEL WITH GFR   CBC with Differential/Platelet   Prediabetes    Controlled A1c 5.6, within range below preDM Concern with obesity, Pre-HTN, HLD  Plan:  1. Not on any therapy currently 2. Encourage improved lifestyle - low carb, low sugar diet, reduce portion size, again try to start regular exercise      Relevant Orders   Hemoglobin A1c   Pure hypercholesterolemia    Nearly at goal LDL 104 The 10-year ASCVD risk score (Arnett DK, et al., 2019) is: 3.4%   Plan: Encourage improved lifestyle - low carb/cholesterol, reduce portion size, start regular exercise      Relevant Orders   COMPLETE METABOLIC PANEL WITH GFR   Lipid panel   Other Visit Diagnoses     Annual physical exam    -  Primary   Relevant Orders   COMPLETE METABOLIC PANEL WITH GFR   CBC with Differential/Platelet   Lipid panel   Hemoglobin A1c   TSH   T4, free       Updated Health Maintenance information Reviewed recent lab results with patient Encouraged improvement to lifestyle with diet and exercise Goal of weight loss   Meds ordered this encounter  Medications   levothyroxine (SYNTHROID) 137 MCG tablet    Sig: Take 1  tablet (137 mcg total) by mouth daily before breakfast.    Dispense:  90 tablet    Refill:  3     Follow up plan: Return in about 1 year (around 09/22/2023) for 1 year fasting lab only then 1 week later Annual Physical.  Future labs 08/2023  Nobie Putnam, [facility] 09/21/2022, 9:24 AM

## 2022-09-21 NOTE — Assessment & Plan Note (Signed)
Nearly at goal LDL 104 The 10-year ASCVD risk score (Arnett DK, et al., 2019) is: 3.4%   Plan: Encourage improved lifestyle - low carb/cholesterol, reduce portion size, start regular exercise

## 2022-09-21 NOTE — Assessment & Plan Note (Signed)
Controlled A1c 5.6, within range below preDM Concern with obesity, Pre-HTN, HLD  Plan:  1. Not on any therapy currently 2. Encourage improved lifestyle - low carb, low sugar diet, reduce portion size, again try to start regular exercise

## 2022-09-21 NOTE — Assessment & Plan Note (Signed)
Controlled GERD On intermittent or every other day PPI

## 2022-09-21 NOTE — Assessment & Plan Note (Signed)
Controlled hypothyroidism on current dose levothyroxine Last TSH Free T4 still in normal range Continue current Levothyroxine 129mcg daily refilled Recheck yearly or 6 months if needed

## 2022-10-19 ENCOUNTER — Other Ambulatory Visit: Payer: Self-pay | Admitting: Family Medicine

## 2022-10-19 DIAGNOSIS — Z1231 Encounter for screening mammogram for malignant neoplasm of breast: Secondary | ICD-10-CM

## 2022-12-05 ENCOUNTER — Ambulatory Visit
Admission: RE | Admit: 2022-12-05 | Discharge: 2022-12-05 | Disposition: A | Discharge status: Home / Self Care | Payer: BC Managed Care – PPO | Source: Ambulatory Visit | Attending: Family Medicine | Admitting: Family Medicine

## 2022-12-05 DIAGNOSIS — Z1231 Encounter for screening mammogram for malignant neoplasm of breast: Secondary | ICD-10-CM | POA: Diagnosis not present

## 2023-03-24 ENCOUNTER — Other Ambulatory Visit: Payer: Self-pay | Admitting: Family Medicine

## 2023-03-24 DIAGNOSIS — K219 Gastro-esophageal reflux disease without esophagitis: Secondary | ICD-10-CM

## 2023-03-24 NOTE — Telephone Encounter (Signed)
Requested Prescriptions  Pending Prescriptions Disp Refills   omeprazole (PRILOSEC) 20 MG capsule [Pharmacy Med Name: OMEPRAZOLE DR 20 MG CAPSULE] 90 capsule 0    Sig: TAKE 1 CAPSULE BY MOUTH EVERY DAY BEFORE BREAKFAST     Gastroenterology: Proton Pump Inhibitors Passed - 03/24/2023  2:36 AM      Passed - Valid encounter within last 12 months    Recent Outpatient Visits           6 months ago Annual physical exam   [place] [facility] Smitty Cords, DO   1 year ago COVID-19 virus infection   [place] [facility] [place], Netta Neat, DO   1 year ago Hypothyroidism due to acquired atrophy of thyroid   [facility] Smitty Cords, DO   2 years ago Annual physical exam   [facility] [place], Netta Neat, DO   2 years ago Hypothyroidism due to acquired atrophy of thyroid   [facility] [place], Netta Neat, [place]

## 2023-04-21 DIAGNOSIS — H02401 Unspecified ptosis of right eyelid: Secondary | ICD-10-CM | POA: Diagnosis not present

## 2023-04-21 DIAGNOSIS — Z961 Presence of intraocular lens: Secondary | ICD-10-CM | POA: Diagnosis not present

## 2023-06-23 ENCOUNTER — Other Ambulatory Visit: Payer: Self-pay | Admitting: Family Medicine

## 2023-06-23 DIAGNOSIS — K219 Gastro-esophageal reflux disease without esophagitis: Secondary | ICD-10-CM

## 2023-06-23 NOTE — Telephone Encounter (Signed)
Requested Prescriptions  Pending Prescriptions Disp Refills   omeprazole (PRILOSEC) 20 MG capsule [Pharmacy Med Name: OMEPRAZOLE DR 20 MG CAPSULE] 90 capsule 0    Sig: TAKE 1 CAPSULE BY MOUTH EVERY DAY BEFORE BREAKFAST     Gastroenterology: Proton Pump Inhibitors Passed - 06/23/2023  2:40 AM      Passed - Valid encounter within last 12 months    Recent Outpatient Visits           9 months ago Annual physical exam   [place] [facility] Smitty Cords, DO   1 year ago COVID-19 virus infection   [place] [facility] [place], Netta Neat, DO   2 years ago Hypothyroidism due to acquired atrophy of thyroid   [facility] Smitty Cords, DO   2 years ago Annual physical exam   [place] [facility] [place], Netta Neat, DO   3 years ago Hypothyroidism due to acquired atrophy of thyroid   [facility] [place], Netta Neat, [place]

## 2023-10-06 ENCOUNTER — Ambulatory Visit: Payer: BC Managed Care – PPO | Admitting: Family Medicine

## 2023-10-06 ENCOUNTER — Encounter: Payer: Self-pay | Admitting: Family Medicine

## 2023-10-06 VITALS — BP 122/80 | HR 75 | Ht 67.0 in | Wt 242.0 lb

## 2023-10-06 DIAGNOSIS — Z0000 Encounter for general adult medical examination without abnormal findings: Secondary | ICD-10-CM

## 2023-10-06 DIAGNOSIS — E034 Atrophy of thyroid (acquired): Secondary | ICD-10-CM

## 2023-10-06 DIAGNOSIS — R7303 Prediabetes: Secondary | ICD-10-CM

## 2023-10-06 DIAGNOSIS — E7800 Pure hypercholesterolemia, unspecified: Secondary | ICD-10-CM

## 2023-10-06 DIAGNOSIS — K219 Gastro-esophageal reflux disease without esophagitis: Secondary | ICD-10-CM

## 2023-10-06 MED ORDER — OMEPRAZOLE 20 MG PO CPDR: 20.0000 mg | DELAYED_RELEASE_CAPSULE | Freq: Every day | ORAL | 0 refills | Status: AC

## 2023-10-06 MED ORDER — LEVOTHYROXINE SODIUM 137 MCG PO TABS: 137.0000 ug | ORAL_TABLET | Freq: Every day | ORAL | 3 refills | Status: AC

## 2023-10-06 NOTE — Progress Notes (Signed)
Subjective:    Patient ID: Cindy Mccoy, female    DOB: [birth year], 63 y.o.   MRN: [id number]  Cindy Mccoy is a 63 y.o. female presenting on 10/06/2023 for Annual Exam   HPI  Discussed the use of AI scribe software for clinical note transcription with the patient, who gave verbal consent to proceed.  History of Present Illness   Cindy Mccoy is a 63 year old female who presents for an annual physical exam.  Here for Annual Physical and Lab Review  She has maintained her weight at 242 pounds since last year and is concerned about the cost and insurance coverage of weight loss medications. She is not diabetic and plans to confirm this with blood work.  FOLLOW-UP HYPOTHYROIDISM last hypothyroidism TSH and Free T4 normal range reviewed last lab 08/2022 - Current dose Levothyroxine is working well Will renew today Check labs next week on Monday 2/17  Elevated A1c / MORBID OBESITY BMI >37 Due for repeat A1c Weight stable Interested in med options   BP is controlled currently Current Meds - Not on medication     GERD On omeprazole 20mg  intermittently  She is currently taking thyroid medication and omeprazole for stomach acid. She tries to limit omeprazole use and mentions her husband sometimes uses her medication. She feels full more easily and eats less than before.        Health Maintenance Discussed vaccines / screenings today     10/06/2023    9:56 AM 11/17/2021   10:45 AM 05/12/2021   10:43 AM  Depression screen PHQ 2/9  Decreased Interest 0 0 0  Down, Depressed, Hopeless 0 0 0  PHQ - 2 Score 0 0 0  Altered sleeping  0 0  Tired, decreased energy  0 0  Change in appetite  0 0  Feeling bad or failure about yourself   0 0  Trouble concentrating  0 0  Moving slowly or fidgety/restless  0 0  Suicidal thoughts  0 0  PHQ-9 Score  0 0  Difficult doing work/chores  Not difficult at all Not difficult at all       10/06/2023    9:56 AM 11/17/2021    10:45 AM 05/12/2021   10:43 AM  GAD 7 : Generalized Anxiety Score  Nervous, Anxious, on Edge 0 0 0  Control/stop worrying 0 0 0  Worry too much - different things 0 0 0  Trouble relaxing 0 0 0  Restless 0 0 0  Easily annoyed or irritable 0 0 0  Afraid - awful might happen 0 0 0  Total GAD 7 Score 0 0 0  Anxiety Difficulty Not difficult at all Not difficult at all Not difficult at all     Past Medical History:  Diagnosis Date   Anxiety    Breast mass, left    Collapsed lung 08/23/2003   Dermatitis    contact   Family history of malignant neoplasm of breast    GERD (gastroesophageal reflux disease)    History of chicken pox    Pure hypercholesterolemia    Unspecified menopausal and postmenopausal disorder    Past Surgical History:  Procedure Laterality Date   BREAST BIOPSY  1981   BREAST BIOPSY Left 10/13/2014   CATARACT EXTRACTION W/PHACO Right 02/17/2022   Procedure: CATARACT EXTRACTION PHACO AND INTRAOCULAR LENS PLACEMENT (IOC) CRIGHT Kizzie Bane;  Surgeon: Estanislado Pandy, MD;  Location: [facility];  Service: Ophthalmology;  Laterality: Right;  25.89 02:19.2   CATARACT EXTRACTION W/PHACO Left 03/03/2022   Procedure: CATARACT EXTRACTION PHACO AND INTRAOCULAR LENS PLACEMENT (IOC) LEFT ENVISTA TORIC LENS;  Surgeon: Estanislado Pandy, MD;  Location: [facility];  Service: Ophthalmology;  Laterality: Left;  7.02 00:53.2   CATARACT EXTRACTION W/PHACO Left 03/14/2022   Procedure: REPOSTIONING OF INTRAOCULAR LENS LEFT;  Surgeon: Estanislado Pandy, MD;  Location: [facility];  Service: Ophthalmology;  Laterality: Left;   Social History   Socioeconomic History   Marital status: Married    Spouse name: Not on file   Number of children: Not on file   Years of education: Not on file   Highest education level: Not on file  Occupational History   Occupation: CNA  Tobacco Use   Smoking status: Former    Current packs/day: 0.00     Types: Cigarettes    Quit date: 2005    Years since quitting: 20.1   Smokeless tobacco: Never   Tobacco comments:    29 pack per year history, quit several years ago  Vaping Use   Vaping status: Never Used  Substance and Sexual Activity   Alcohol use: No    Comment: 1-2 drinks per month   Drug use: No    Comment: Remote marijuana in highschool- no injections   Sexual activity: Not on file  Other Topics Concern   Not on file  Social History Narrative   Not on file   Social Drivers of Health   Financial Resource Strain: Not on file  Food Insecurity: Not on file  Transportation Needs: Not on file  Physical Activity: Not on file  Stress: Not on file  Social Connections: Not on file  Intimate Partner Violence: Not on file   Family History  Problem Relation Age of Onset   COPD Mother    Breast cancer Mother    Heart disease Father        CHF   Current Outpatient Medications on File Prior to Visit  Medication Sig   Misc Natural Products (YUMVS BEET ROOT-TART CHERRY PO) Take by mouth.   Multiple Vitamin (MULTIVITAMIN) tablet Take 1 tablet by mouth daily.   No current facility-administered medications on file prior to visit.    Review of Systems  Constitutional:  Negative for activity change, appetite change, chills, diaphoresis, fatigue and fever.  HENT:  Negative for congestion and hearing loss.   Eyes:  Negative for visual disturbance.  Respiratory:  Negative for cough, chest tightness, shortness of breath and wheezing.   Cardiovascular:  Negative for chest pain, palpitations and leg swelling.  Gastrointestinal:  Negative for abdominal pain, constipation, diarrhea, nausea and vomiting.  Genitourinary:  Negative for dysuria, frequency and hematuria.  Musculoskeletal:  Negative for arthralgias and neck pain.  Skin:  Negative for rash.  Neurological:  Negative for dizziness, weakness, light-headedness, numbness and headaches.  Hematological:  Negative for adenopathy.   Psychiatric/Behavioral:  Negative for behavioral problems, dysphoric mood and sleep disturbance.    Per HPI unless specifically indicated above     Objective:    BP 122/80   Pulse 75   Ht 5\' 7"  (1.702 m)   Wt 242 lb (109.8 kg)   LMP 09/23/2012   SpO2 97%   BMI 37.90 kg/m   Wt Readings from Last 3 Encounters:  10/06/23 242 lb (109.8 kg)  09/21/22 242 lb (109.8 kg)  03/14/22 246 lb (111.6 kg)    Physical Exam Vitals and nursing note reviewed.  Constitutional:  General: She is not in acute distress.    Appearance: She is well-developed. She is obese. She is not diaphoretic.     Comments: Well-appearing, comfortable, cooperative  HENT:     Head: Normocephalic and atraumatic.  Eyes:     General:        Right eye: No discharge.        Left eye: No discharge.     Conjunctiva/sclera: Conjunctivae normal.     Pupils: Pupils are equal, round, and reactive to light.  Neck:     Thyroid: No thyromegaly.     Vascular: No carotid bruit.  Cardiovascular:     Rate and Rhythm: Normal rate and regular rhythm.     Pulses: Normal pulses.     Heart sounds: Normal heart sounds. No murmur heard. Pulmonary:     Effort: Pulmonary effort is normal. No respiratory distress.     Breath sounds: Normal breath sounds. No wheezing or rales.  Abdominal:     General: Bowel sounds are normal. There is no distension.     Palpations: Abdomen is soft. There is no mass.     Tenderness: There is no abdominal tenderness.  Musculoskeletal:        General: No tenderness. Normal range of motion.     Cervical back: Normal range of motion and neck supple.     Right lower leg: No edema.     Left lower leg: No edema.     Comments: Upper / Lower Extremities: - Normal muscle tone, strength bilateral upper extremities 5/5, lower extremities 5/5  Lymphadenopathy:     Cervical: No cervical adenopathy.  Skin:    General: Skin is warm and dry.     Findings: No erythema or rash.  Neurological:     Mental  Status: She is alert and oriented to person, place, and time.     Comments: Distal sensation intact to light touch all extremities  Psychiatric:        Mood and Affect: Mood normal.        Behavior: Behavior normal.        Thought Content: Thought content normal.     Comments: Well groomed, good eye contact, normal speech and thoughts     Results for orders placed or performed in visit on 09/15/22  T4, free   Collection Time: 09/16/22  9:54 AM  Result Value Ref Range   Free T4 1.5 0.8 - 1.8 ng/dL  TSH   Collection Time: 09/16/22  9:54 AM  Result Value Ref Range   TSH 1.51 0.40 - 4.50 mIU/L  Hemoglobin A1c   Collection Time: 09/16/22  9:54 AM  Result Value Ref Range   Hgb A1c MFr Bld 5.6 <5.7 % of total Hgb   Mean Plasma Glucose 114 mg/dL   eAG (mmol/L) 6.3 mmol/L  Lipid panel   Collection Time: 09/16/22  9:54 AM  Result Value Ref Range   Cholesterol 178 <200 mg/dL   HDL 51 > OR = 50 mg/dL   Triglycerides 409 <811 mg/dL   LDL Cholesterol (Calc) 104 (H) mg/dL (calc)   Total CHOL/HDL Ratio 3.5 <5.0 (calc)   Non-HDL Cholesterol (Calc) 127 <130 mg/dL (calc)  CBC with Differential   Collection Time: 09/16/22  9:54 AM  Result Value Ref Range   WBC 6.2 3.8 - 10.8 Thousand/uL   RBC 4.99 3.80 - 5.10 Million/uL   Hemoglobin 15.0 11.7 - 15.5 g/dL   HCT 91.4 78.2 - 95.6 %   MCV 88.8  80.0 - 100.0 fL   MCH 30.1 27.0 - 33.0 pg   MCHC 33.9 32.0 - 36.0 g/dL   RDW 16.1 09.6 - 04.5 %   Platelets 222 140 - 400 Thousand/uL   MPV 11.3 7.5 - 12.5 fL   Neutro Abs 3,435 1,500 - 7,800 cells/uL   Lymphs Abs 1,779 850 - 3,900 cells/uL   Absolute Monocytes 533 200 - 950 cells/uL   Eosinophils Absolute 415 15 - 500 cells/uL   Basophils Absolute 37 0 - 200 cells/uL   Neutrophils Relative % 55.4 %   Total Lymphocyte 28.7 %   Monocytes Relative 8.6 %   Eosinophils Relative 6.7 %   Basophils Relative 0.6 %  Comprehensive Metabolic Panel (CMET)   Collection Time: 09/16/22  9:54 AM  Result  Value Ref Range   Glucose, Bld 102 (H) 65 - 99 mg/dL   BUN 12 7 - 25 mg/dL   Creat 4.09 8.11 - 9.14 mg/dL   BUN/Creatinine Ratio SEE NOTE: 6 - 22 (calc)   Sodium 142 135 - 146 mmol/L   Potassium 4.7 3.5 - 5.3 mmol/L   Chloride 106 98 - 110 mmol/L   CO2 25 20 - 32 mmol/L   Calcium 9.0 8.6 - 10.4 mg/dL   Total Protein 6.3 6.1 - 8.1 g/dL   Albumin 3.8 3.6 - 5.1 g/dL   Globulin 2.5 1.9 - 3.7 g/dL (calc)   AG Ratio 1.5 1.0 - 2.5 (calc)   Total Bilirubin 0.5 0.2 - 1.2 mg/dL   Alkaline phosphatase (APISO) 77 37 - 153 U/L   AST 13 10 - 35 U/L   ALT 12 6 - 29 U/L      Assessment & Plan:   Problem List Items Addressed This Visit     GERD (gastroesophageal reflux disease)   Relevant Medications   omeprazole (PRILOSEC) 20 MG capsule   Hypothyroidism   Relevant Medications   levothyroxine (SYNTHROID) 137 MCG tablet   Morbid obesity (HCC)   Prediabetes   Pure hypercholesterolemia   Other Visit Diagnoses       Annual physical exam    -  Primary        Updated Health Maintenance information Fasting labs on Monday 2/17 Encouraged improvement to lifestyle with diet and exercise Goal of weight loss  Morbid Obesity BMI >37 Stable weight. Discussed potential weight loss medications, but patient expressed concerns about cost and insurance coverage. Patient to consider and check insurance coverage for Wegovy and Zepbound. -Consider weight loss medications if insurance coverage is confirmed.  Hypothyroidism Check Lab Monday TSH T4 No changes in symptoms or medication dosage reported. -Continue current thyroid medication dosage. Levothyroxine 137 mcg -[facility] [address].  Gastroesophageal Reflux Disease (GERD) Patient reports intermittent use of omeprazole, with no significant symptoms on days without medication. -Continue omeprazole as needed. -[facility] [address] [address] [facility].  General Health Maintenance -Complete fasting lab work on  Monday, 10/09/2023 at 8:30 AM. -Consider coronary artery calcium score for heart disease screening. -Continue annual mammograms, next due in Spring 2025. -Consider Cologuard for colon cancer screening, check insurance coverage. -Consider Shingrix vaccine for shingles prevention. -Schedule annual visit for next year on a Friday morning.  Muscle Wasting Patient expressed concern about muscle loss with aging. Discussed the importance of protein intake and resistance training. -Consider incorporating resistance training and increased protein intake into daily routine.         No orders of the defined types were  placed in this encounter.   Meds ordered this encounter  Medications   levothyroxine (SYNTHROID) 137 MCG tablet    Sig: Take 1 tablet (137 mcg total) by mouth daily before breakfast.    Dispense:  90 tablet    Refill:  3   omeprazole (PRILOSEC) 20 MG capsule    Sig: Take 1 capsule (20 mg total) by mouth daily before breakfast.    Dispense:  90 capsule    Refill:  0     Follow up plan: Return for 1 year fasting lab > 1 week later Annual Physical.  Future orders for [facility] 2/17 TSH T4 all labs  Saralyn Pilar, DO [facility] 10/06/2023, 9:36 AM

## 2023-10-06 NOTE — Patient Instructions (Addendum)
Thank you for coming to the office today.  For Mammogram screening for breast cancer   Call the Imaging Center below anytime to schedule your own appointment now that order has been placed.  [facility] at [facility] [address], [address] [place], [place] [address] Phone: [phone]  -------------------  Reginia Forts TO Check Cost & Coverage of Zepbound Please contact Research officer, trade union (manufacturer for [place]) 1-800-LillyRx 6094272486) - Live agent to discuss cost and coverage.  [email or web address]   ---------------  Colon Cancer Screening: - For all adults age 54+ routine colon cancer screening is highly recommended.     - Recent guidelines from American Cancer Society recommend starting age of 64 - Early detection of colon cancer is important, because often there are no warning signs or symptoms, also if found early usually it can be cured. Late stage is hard to treat.  - If you are not interested in Colonoscopy screening (if done and normal you could be cleared for 5 to 10 years until next due), then Cologuard is an excellent alternative for screening test for Colon Cancer. It is highly sensitive for detecting DNA of colon cancer from even the earliest stages. Also, there is NO bowel prep required. - If Cologuard is NEGATIVE, then it is good for 3 years before next due - If Cologuard is POSITIVE, then it is strongly advised to get a Colonoscopy, which allows the GI doctor to locate the source of the cancer or polyp (even very early stage) and treat it by removing it. ------------------------- If you would like to proceed with Cologuard (stool DNA test) - FIRST, call your insurance company and tell them you want to check cost of Cologuard tell them CPT Code 40102 (it may be completely covered and you could get for no cost, OR max cost without any coverage is about  $600). Also, keep in mind if you do NOT open the kit, and decide not to do the test, you will NOT be charged, you should contact the company if you decide not to do the test. - If you want to proceed, you can notify us (phone message, MyChart Message, or at next visit) and we will order it for you. The test kit will be delivered to you house within about 1 week. Follow instructions to collect sample, you may call the company for any help or questions, 24/7 telephone support at [phone].  ------------  Shingirx vaccine at the pharmacy   CONSIDER Coronary Calcium Score Cardiac CT Scan. This is a screening test for patients aged 13-50+ with cardiovascular risk factors or who are healthy but would be interested in Cardiovascular Screening for heart disease. Even if there is a family history of heart disease, this imaging can be useful. Typically it can be done every 5+ years or at a different timeline we agree on  The scan will look at the chest and mainly focus on the heart and identify early signs of calcium build up or blockages within the heart arteries. It is not 100% accurate for identifying blockages or heart disease, but it is useful to help [place] predict who may have some early changes or be at risk in the future for a heart attack or cardiovascular problem.  The results are reviewed by a Cardiologist and they will document the results. It should become available on MyChart. Typically the results are divided into percentiles based on other patients of the same demographic and age. So it will  compare your risk to others similar to you. If you have a higher score >99 or higher percentile >75%tile, it is recommended to consider Statin cholesterol therapy and or referral to Cardiologist. I will try to help explain your results and if we have questions we can contact the Cardiologist.  You will be contacted for scheduling. Usually it is done at any imaging facility through [facility], [facility] or [facility].  The cost is $99 flat fee total and it does not go through insurance, so no authorization is required.     DUE for FASTING BLOOD WORK (no food or drink after midnight before the lab appointment, only water or coffee without cream/sugar on the morning of)  Next week  - Make sure Lab Only appointment is at about 1 week before your next appointment, so that results will be available  For Lab Results, once available within 2-3 days of blood draw, you can can log in to MyChart online to view your results and a brief explanation. Also, we can discuss results at next follow-up visit.   Please schedule a Follow-up Appointment to: Return for 1 year fasting lab > 1 week later Annual Physical.  If you have any other questions or concerns, please feel free to call the office or send a message through MyChart. You may also schedule an earlier appointment if necessary.  Additionally, you may be receiving a survey about your experience at our office within a few days to 1 week by e-mail or mail. We value your feedback.  Saralyn Pilar, DO [facility], [place]

## 2023-10-09 ENCOUNTER — Other Ambulatory Visit: Payer: BC Managed Care – PPO

## 2023-10-09 DIAGNOSIS — E034 Atrophy of thyroid (acquired): Secondary | ICD-10-CM

## 2023-10-09 DIAGNOSIS — R7303 Prediabetes: Secondary | ICD-10-CM | POA: Diagnosis not present

## 2023-10-09 DIAGNOSIS — E7800 Pure hypercholesterolemia, unspecified: Secondary | ICD-10-CM | POA: Diagnosis not present

## 2023-10-09 DIAGNOSIS — Z0000 Encounter for general adult medical examination without abnormal findings: Secondary | ICD-10-CM

## 2023-10-09 DIAGNOSIS — K219 Gastro-esophageal reflux disease without esophagitis: Secondary | ICD-10-CM | POA: Diagnosis not present

## 2023-10-10 LAB — COMPLETE METABOLIC PANEL WITH GFR
AG Ratio: 1.8 (calc) (ref 1.0–2.5)
ALT: 11 U/L (ref 6–29)
AST: 12 U/L (ref 10–35)
Albumin: 4.1 g/dL (ref 3.6–5.1)
Alkaline phosphatase (APISO): 83 U/L (ref 37–153)
BUN: 13 mg/dL (ref 7–25)
CO2: 31 mmol/L (ref 20–32)
Calcium: 9.1 mg/dL (ref 8.6–10.4)
Chloride: 105 mmol/L (ref 98–110)
Creat: 0.79 mg/dL (ref 0.50–1.05)
Globulin: 2.3 g/dL (ref 1.9–3.7)
Glucose, Bld: 107 mg/dL — ABNORMAL HIGH (ref 65–99)
Potassium: 4.4 mmol/L (ref 3.5–5.3)
Sodium: 143 mmol/L (ref 135–146)
Total Bilirubin: 0.4 mg/dL (ref 0.2–1.2)
Total Protein: 6.4 g/dL (ref 6.1–8.1)
eGFR: 85 mL/min/{1.73_m2} (ref >=60)

## 2023-10-10 LAB — TSH: TSH: 1.61 m[IU]/L (ref 0.40–4.50)

## 2023-10-10 LAB — CBC WITH DIFFERENTIAL/PLATELET
Absolute Lymphocytes: 1701 {cells}/uL (ref 850–3900)
Absolute Monocytes: 536 {cells}/uL (ref 200–950)
Basophils Absolute: 69 {cells}/uL (ref 0–200)
Basophils Relative: 1.1 %
Eosinophils Absolute: 328 {cells}/uL (ref 15–500)
Eosinophils Relative: 5.2 %
HCT: 45.6 % — ABNORMAL HIGH (ref 35.0–45.0)
Hemoglobin: 14.8 g/dL (ref 11.7–15.5)
MCH: 29.1 pg (ref 27.0–33.0)
MCHC: 32.5 g/dL (ref 32.0–36.0)
MCV: 89.8 fL (ref 80.0–100.0)
MPV: 11.6 fL (ref 7.5–12.5)
Monocytes Relative: 8.5 %
Neutro Abs: 3667 {cells}/uL (ref 1500–7800)
Neutrophils Relative %: 58.2 %
Platelets: 222 10*3/uL (ref 140–400)
RBC: 5.08 10*6/uL (ref 3.80–5.10)
RDW: 13.4 % (ref 11.0–15.0)
Total Lymphocyte: 27 %
WBC: 6.3 10*3/uL (ref 3.8–10.8)

## 2023-10-10 LAB — HEMOGLOBIN A1C
Hgb A1c MFr Bld: 5.6 %{Hb} (ref <5.7)
Mean Plasma Glucose: 114 mg/dL
eAG (mmol/L): 6.3 mmol/L

## 2023-10-10 LAB — T4, FREE: Free T4: 1.6 ng/dL (ref 0.8–1.8)

## 2023-10-10 LAB — LIPID PANEL
Cholesterol: 192 mg/dL (ref <200)
HDL: 58 mg/dL (ref >=50)
LDL Cholesterol (Calc): 109 mg/dL — ABNORMAL HIGH
Non-HDL Cholesterol (Calc): 134 mg/dL — ABNORMAL HIGH (ref <130)
Total CHOL/HDL Ratio: 3.3 (calc) (ref <5.0)
Triglycerides: 132 mg/dL (ref <150)

## 2023-10-11 ENCOUNTER — Encounter: Payer: Self-pay | Admitting: Family Medicine

## 2023-11-13 ENCOUNTER — Other Ambulatory Visit: Payer: Self-pay | Admitting: Family Medicine

## 2023-11-13 DIAGNOSIS — Z1231 Encounter for screening mammogram for malignant neoplasm of breast: Secondary | ICD-10-CM

## 2023-11-20 ENCOUNTER — Other Ambulatory Visit: Payer: Self-pay | Admitting: Family Medicine

## 2023-11-20 DIAGNOSIS — K219 Gastro-esophageal reflux disease without esophagitis: Secondary | ICD-10-CM

## 2023-11-20 DIAGNOSIS — E034 Atrophy of thyroid (acquired): Secondary | ICD-10-CM

## 2023-12-18 ENCOUNTER — Ambulatory Visit
Admission: RE | Admit: 2023-12-18 | Discharge: 2023-12-18 | Disposition: A | Discharge status: Home / Self Care | Source: Ambulatory Visit | Attending: Family Medicine | Admitting: Family Medicine

## 2023-12-18 DIAGNOSIS — Z1231 Encounter for screening mammogram for malignant neoplasm of breast: Secondary | ICD-10-CM | POA: Diagnosis not present

## 2024-05-02 DIAGNOSIS — Z961 Presence of intraocular lens: Secondary | ICD-10-CM | POA: Diagnosis not present

## 2024-05-02 DIAGNOSIS — H02401 Unspecified ptosis of right eyelid: Secondary | ICD-10-CM | POA: Diagnosis not present

## 2024-09-24 ENCOUNTER — Telehealth: Payer: Self-pay

## 2024-09-24 NOTE — Telephone Encounter (Signed)
 Copied from CRM [id number]. Topic: Appointments - Scheduling Inquiry for Clinic >> Sep 23, 2024  1:23 PM Cindy Mccoy wrote: Reason for CRM: Pt wants to keep her upcoming lab appt, she had to reschedule her CPE appt to 02/23. Wants to know if she needs to reschedule her lab appt for a week prior to her CPE.

## 2024-09-24 NOTE — Telephone Encounter (Signed)
 Appointments scheduled

## 2024-09-27 ENCOUNTER — Ambulatory Visit: Payer: Self-pay

## 2024-09-27 ENCOUNTER — Other Ambulatory Visit: Payer: BC Managed Care – PPO

## 2024-09-27 NOTE — Telephone Encounter (Signed)
 Left message for patient to return call OK to advise

## 2024-09-27 NOTE — Telephone Encounter (Signed)
 I reviewed the triage. Let's keep her current apt. I don't think there is anything I would suggest otherwise sooner.  Marsa Officer, DO [facility] 09/27/2024, 1:20 PM

## 2024-09-27 NOTE — Telephone Encounter (Signed)
 Pt is a remarkably poor historian. The NT questionnaire was a confusing interaction. Pt stated she feels like a rush wash over her, claims it is related to either dust on curtains or taking too much aleve, states she can smell breast cancer, there is a funk going around, maybe I am a hypochondriac. Pt noted to be in no acute distress and her ability to perform her daily activities has not been affected. Home care advised.     FYI Only or Action Required?: FYI only for provider: home care advised.  Patient was last seen in primary care on 10/06/2023 by Edman Marsa PARAS, DO.  Called Nurse Triage reporting Dizziness.  Symptoms began a week ago.  Interventions attempted: Nothing.  Symptoms are: stable.  Triage Disposition: Home Care  Patient/caregiver understands and will follow disposition?: Yes   Reason for Triage: Pt is experiencing dizzyness, has had history of a lung colapsed. She states she possibly has croup.    Reason for Disposition  Dizziness caused by recent heat exposure    Pt reports this began when she smelled breast cancer, states a funk is going around  Answer Assessment - Initial Assessment Questions 1. DESCRIPTION: Describe your dizziness.     I'm not feeling as dizzy ... Just like a rush. Like a wash over me, kind of. I don't know. I don't know. Maybe I am a hypochondriac. ... 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 Have you ever smelled breast cancer? ... I have to go back tonight. It's only for 2 hours. And the dust on those curtains.   2. LIGHTHEADED: Do you feel lightheaded? (e.g., somewhat faint, woozy, weak upon standing)     Denies  3. VERTIGO: Do you feel like either you or the room is spinning or tilting? (i.e., vertigo)     Denies  4. SEVERITY: How bad is it?  Do you feel like you are going to faint? Can you stand and walk?     Yes pt can walk   5. ONSET:  When did the dizziness begin?     Friday   6. AGGRAVATING FACTORS: Does  anything make it worse? (e.g., standing, change in head position)     Aleve, robitussin   9. RECURRENT SYMPTOM: Have you had dizziness before? If Yes, ask: When was the last time? What happened that time?     Yeah when I had a collapsed lung. I could walk the dog down the street, but I couldn't walk the dog back up. But now I am feeling better.   10. OTHER SYMPTOMS: Do you have any other symptoms? (e.g., fever, chest pain, vomiting, diarrhea, bleeding)       Denies  Protocols used: Dizziness - Lightheadedness-A-AH

## 2024-10-04 ENCOUNTER — Ambulatory Visit: Payer: BC Managed Care – PPO | Admitting: Family Medicine

## 2024-10-07 ENCOUNTER — Other Ambulatory Visit

## 2024-10-14 ENCOUNTER — Encounter: Admitting: Family Medicine
